# Patient Record
Sex: Female | Born: 1985 | Race: White | Hispanic: No | Marital: Single | Smoking: Never smoker
Health system: Southern US, Community
[De-identification: ages and names within clinical notes are randomized; demographics above are authoritative.]

## PROBLEM LIST (undated history)

## (undated) ENCOUNTER — Inpatient Hospital Stay (HOSPITAL_COMMUNITY): Payer: BC Managed Care – PPO

## (undated) ENCOUNTER — Inpatient Hospital Stay (HOSPITAL_COMMUNITY): Payer: Self-pay

## (undated) DIAGNOSIS — F32A Depression, unspecified: Secondary | ICD-10-CM

## (undated) DIAGNOSIS — E039 Hypothyroidism, unspecified: Secondary | ICD-10-CM

## (undated) DIAGNOSIS — F329 Major depressive disorder, single episode, unspecified: Secondary | ICD-10-CM

## (undated) DIAGNOSIS — E079 Disorder of thyroid, unspecified: Secondary | ICD-10-CM

## (undated) HISTORY — DX: Disorder of thyroid, unspecified: E07.9

## (undated) HISTORY — DX: Depression, unspecified: F32.A

## (undated) HISTORY — DX: Major depressive disorder, single episode, unspecified: F32.9

## (undated) HISTORY — PX: ORTHOPEDIC SURGERY: SHX850

---
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
## 2012-06-27 DIAGNOSIS — N6019 Diffuse cystic mastopathy of unspecified breast: Secondary | ICD-10-CM | POA: Insufficient documentation

## 2014-03-07 ENCOUNTER — Ambulatory Visit (INDEPENDENT_AMBULATORY_CARE_PROVIDER_SITE_OTHER): Payer: BLUE CROSS/BLUE SHIELD | Admitting: Sports Medicine

## 2014-03-07 ENCOUNTER — Ambulatory Visit: Payer: Self-pay | Admitting: Sports Medicine

## 2014-03-07 ENCOUNTER — Encounter: Payer: Self-pay | Admitting: Sports Medicine

## 2014-03-07 VITALS — BP 133/90 | HR 65 | Ht 66.0 in | Wt 190.0 lb

## 2014-03-07 DIAGNOSIS — S86311A Strain of muscle(s) and tendon(s) of peroneal muscle group at lower leg level, right leg, initial encounter: Secondary | ICD-10-CM

## 2014-03-07 MED ORDER — NITROGLYCERIN 0.2 MG/HR TD PT24: MEDICATED_PATCH | TRANSDERMAL | Status: DC

## 2014-03-07 NOTE — Progress Notes (Signed)
Subjective:    Patient ID: Denise Mccall, female    DOB: July 15, 1985, 29 y.o.   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 She suffered an inversion injury while running. Immediate pain and swelling. She was seen and treated by Dr. Dion Saucier. X-rays did not show a fracture. She was placed into a walking boot for about 2-3 weeks after which she transitioned into a med spec brace. All of her swelling was initially along the lateral ankle. Her persistent pain is also along the lateral ankle with radiating pain into her lateral foot. Worse with activity such as running. She was on meloxicam for a month but did not notice any benefit from this. She has had injuries and issues with his ankle in the past. She suffered a rather significant sprain while in high school. She was told that she may need an ankle reconstruction for ankle laxity but she decided to change sports instead. Since then she has had intermittent discomfort in her ankle as well as intermittent swelling but her current discomfort is different in nature than her chronic issues. No pain more proximally in the knee. Past medical history reviewed. She is on Synthroid for Hashimoto's thyroiditis. Also on birth control pills. Surgical history significant for prior anterior cruciate ligament reconstruction. She has also had a ganglion cyst removed from her left wrist and a "bone spur" removed from her right wrist No known drug allergies. She works as a Optometrist    Review of Systems     Objective:   Physical Exam Well-developed, well-nourished. No acute distress. Awake alert and oriented 3. Vital signs reviewed.  Right ankle: Full range of motion. No effusion. No soft tissue swelling. She has significant ankle laxity with a 3+ talar tilt and a positive anterior drawer. She is tender to palpation just  posterior to the lateral malleolus along the course of the peroneal tendons. Reproducible pain with peroneal tendon stretching and resisted eversion of the foot. Slight tenderness at the base of the fifth metatarsal but no swelling here. No tenderness over the navicular nor over the medial malleolus. No peroneal dislocation or subluxation with ankle circumduction. Neurovascularly intact distally.  Patient also has bilateral genu recurvatum and is able to touch each thumb to her forearm. Beighton score was not calculated but this patient likely has underlying hypermobility.  MSK ultrasound of the right ankle was performed. Limited images of the lateral ankle were obtained. There is significant fluid surrounding the peroneal tendons. It is difficult to see a discrete tear but this may be obscured by the fluid. Findings consistent with a significant peroneal tendon strain and possible partial tear.       Assessment & Plan:  Right ankle pain secondary to peroneal tendon strain/probable partial tear Hypermobility Chronic ankle instability  We will try the patient on a quarter patch of nitroglycerin daily. She has a remote history of migraines and is instructed to discontinue the nitroglycerin immediately if she experiences headaches. She will discontinue her med spec brace which she finds uncomfortable and instead will use a body helix compression sleeve with activity. Her sister-in-law is a physical therapist at [facility] and I want her to start some rehabilitation under her supervision. She will follow-up with me in 4 weeks. If symptoms persist or worsen I may need to consider more advanced imaging in the form of an  MRI. Call with questions or concerns in the interim.

## 2014-03-07 NOTE — Patient Instructions (Signed)

## 2014-04-07 ENCOUNTER — Encounter: Payer: Self-pay | Admitting: Sports Medicine

## 2014-04-07 ENCOUNTER — Ambulatory Visit (INDEPENDENT_AMBULATORY_CARE_PROVIDER_SITE_OTHER): Payer: BLUE CROSS/BLUE SHIELD | Admitting: Sports Medicine

## 2014-04-07 VITALS — BP 121/86 | HR 67 | Ht 66.0 in | Wt 190.0 lb

## 2014-04-07 DIAGNOSIS — S86311D Strain of muscle(s) and tendon(s) of peroneal muscle group at lower leg level, right leg, subsequent encounter: Secondary | ICD-10-CM

## 2014-04-07 MED ORDER — NITROGLYCERIN 0.2 MG/HR TD PT24
MEDICATED_PATCH | TRANSDERMAL | Status: DC
Start: 2014-04-07 — End: 2014-12-04

## 2014-04-07 NOTE — Progress Notes (Signed)
  Denise Mccall - 29 y.o. female MRN [id number]  Date of birth: [birth year]  SUBJECTIVE:  Including CC & ROS.  Chief Complaint  Patient presents with  . Ankle Pain   Denise Mccall presents for follow up of right ankle pain. Pt has history of frequent ankle sprains, most recent major episode in October 2015. She has been started on nitro patch therapy, says she was doing this daily for the first few weeks but recently has not been applying them. She also has been doing band therapy exercises that she was given by her sister in law who is a PT. 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 She went for a few short runs with her kids over the past few weeks and notes pain afterward. At this time she does not feel like she would want to do surgery.   HISTORY: Past Medical, Surgical, Social, and Family History Reviewed & Updated per EMR.    PHYSICAL EXAM:  VS: BP:121/86 mmHg  HR:67bpm  TEMP: ( )  RESP:   HT:5\' 6"  (167.6 cm)   WT:190 lb (86.183 kg)  BMI:30.7 PHYSICAL EXAM: General: NAD Right ankle: INSPECTION: no visible deformities, no ecchymosis, no swelling PALPATION: TTP worse in posterior lateral malleolus, less so inferior lat malleolus and base of 5th metarsal. SPECIAL TESTS: Positive anterior drawer and talar tilt test, significant ankle laxity NEUROVASCULAR STATUS: 2+ DP pulses bilaterally   ASSESSMENT & PLAN: See problem based charting & AVS for pt instructions. 1. Right ankle pain secondary to probable peroneal tendon strain/tear: slight improvement, partially adherent to nitro patch therapy but counseled regarding not seeing results this early in therapy. Refill sent for nitro patch #30 refills 3. Pt to continue PT exercises, body helix sleeve and can use body glide on achilles tendon for comfort, f/u 4 weeks for repeat ultrasound.

## 2014-04-15 ENCOUNTER — Telehealth: Payer: Self-pay | Admitting: Sports Medicine

## 2014-04-15 ENCOUNTER — Encounter: Payer: Self-pay | Admitting: *Deleted

## 2014-04-15 NOTE — Telephone Encounter (Signed)
I spoke with the patient on the phone today regarding her persistent ankle pain. She would like to discuss her case with an orthopedic surgeon. I recommended referral to Dr. Lajoyce Cornersuda at [facility]. I will refer her to him for his input and will defer further workup and treatment to his discretion.

## 2014-04-15 NOTE — Telephone Encounter (Signed)
-----   Message from Lizbeth BarkMelanie L Ceresi sent at 04/14/2014  1:43 PM EDT ----- Regarding: phone message Contact: [phone] Pt wants to discuss getting referred to a surgeon for her ankle injury.

## 2014-04-15 NOTE — Progress Notes (Signed)
Patient ID: Denise Mccall, female   DOB: Nov 16, 1985, 29 y.o.   MRN: [id number] DR DUDA Thursday MARCH 31ST AT [address] [facility] [address], [place], [place] [address] [phone]

## 2014-05-05 ENCOUNTER — Other Ambulatory Visit: Payer: Self-pay | Admitting: Orthopedic Surgery

## 2014-05-05 DIAGNOSIS — M25571 Pain in right ankle and joints of right foot: Secondary | ICD-10-CM

## 2014-05-14 ENCOUNTER — Other Ambulatory Visit: Payer: BLUE CROSS/BLUE SHIELD | Admitting: Sports Medicine

## 2014-05-16 ENCOUNTER — Ambulatory Visit
Admission: RE | Admit: 2014-05-16 | Discharge: 2014-05-16 | Disposition: A | Discharge status: Home / Self Care | Payer: BC Managed Care – PPO | Source: Ambulatory Visit | Attending: Orthopedic Surgery | Admitting: Orthopedic Surgery

## 2014-05-16 DIAGNOSIS — M25571 Pain in right ankle and joints of right foot: Secondary | ICD-10-CM

## 2014-11-13 LAB — TSH: TSH: 1.48 u[IU]/mL (ref 0.41–5.90)

## 2014-12-04 ENCOUNTER — Encounter: Payer: Self-pay | Admitting: Endocrinology

## 2014-12-04 ENCOUNTER — Ambulatory Visit (INDEPENDENT_AMBULATORY_CARE_PROVIDER_SITE_OTHER): Payer: BC Managed Care – PPO | Admitting: Endocrinology

## 2014-12-04 VITALS — BP 128/72 | HR 55 | Temp 97.8°F | Resp 14 | Ht 66.0 in | Wt 213.2 lb

## 2014-12-04 DIAGNOSIS — E038 Other specified hypothyroidism: Secondary | ICD-10-CM | POA: Diagnosis not present

## 2014-12-04 DIAGNOSIS — E063 Autoimmune thyroiditis: Secondary | ICD-10-CM | POA: Insufficient documentation

## 2014-12-04 MED ORDER — THYROID 130 MG PO TABS: ORAL_TABLET | ORAL | Status: DC

## 2014-12-04 NOTE — Progress Notes (Signed)
Patient ID: Denise Mccall, female   DOB: [birth year], 29 y.o.   MRN: [id number]            Reason for Appointment:  Hypothyroidism, new visit    History of Present Illness:   The Hypothyroidism was first diagnosed in 881994 when she was 29 years old and she was found to have a goiter on incidental exam.  Not clear if she was having any other symptoms at that time She apparently has been on levothyroxine since then  She says that when her thyroid levels are not controlled she will have symptoms of fatigue, weight gain, hair loss. She has had various doses over the last several years and has seen endocrinologist before she moved to the area about a year ago She is usually very compliant with taking her medication before breakfast and not taking any vitamins or calcium at the same time  Apparently for the last 3 months or so she has had increasing fatigue, weight gain of 20 pounds, hair loss which she thinks is related to her thyroid. However she also has been having more difficulty sleeping as well as tending to have sweating spells especially at night  The patient has been treated with  doses ranging from 125-175; previously was on brand name Synthroid and more recently has been taking levothyroxine.  Her dose has been about the same for 1-1/2 years    Lab results from previous testing is not available, recently had tests done by her gynecologist Free T4 was normal at 1.07  Lab Results  Component Value Date   TSH 1.48 11/13/2014     No past medical history on file.  No past surgical history on file.  Family History  Problem Relation Age of Onset  . Hypothyroidism Mother   . Diabetes Paternal Grandmother     Social History:  reports that she has never smoked. She does not have any smokeless tobacco history on file. Her alcohol and drug histories are not on file.  Allergies: No Known Allergies    Medication List       This list is accurate as of: 12/04/14 12:47 PM.  Always  use your most recent med list.               thyroid 130 MG tablet  Commonly known as:  ARMOUR  Take 1 pill daily and 1/2 extra weekly        Review of Systems:  ENDOCRINOLOGY:  no history of Diabetes.           Review of Systems  Constitutional: Positive for malaise/fatigue.  Eyes:       No swelling of the eyes  Cardiovascular: Positive for palpitations. Negative for leg swelling.       Occasionally he may have a feeling of skipping of her heart beat.  She thinks that when she exercises her heart rate goes very fast.  Has not been seen by any physician for this  Gastrointestinal:       She was having some abdominal pain in the right mid section and this is better with avoiding artificial sweeteners  Neurological: Negative for tingling.   Menstrual cycles have been regular since she went off birth control pill  No complaints of facial or excessive body. No complaints of flushing of the skin   Examination:    BP 128/72 mmHg  Pulse 55  Temp(Src) 97.8 F (36.6 C)  Resp 14  Ht 5\' 6"  (1.676 m)  Wt 213 lb  3.2 oz (96.707 kg)  BMI 34.43 kg/m2  SpO2 97%  GENERAL:  Large build with mild generalized obesity.   No pallor, clubbing, lymphadenopathy or edema.  Skin:  no rash or pigmentation.  EYES:  No prominence of the eyes or swelling of the eyelids  ENT: Oral mucosa and tongue normal.  THYROID:  Not enlarged, just palpable on the right side and smooth, soft.  HEART:  Normal  S1 and S2; heart rhythm is regular, no murmur or click.  CHEST:    Lungs: Vescicular breath sounds heard equally.  No crepitations/ wheeze.  ABDOMEN:  No distention.  Liver and spleen not palpable.  No other mass or tenderness.  NEUROLOGICAL: Reflexes are normal bilaterally at biceps.  JOINTS:  Normal peripheral joints.   Assessments 1. 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 She reports symptoms  of fatigue and weight gain as well as hair loss which is unexplained by her thyroid levels which were normal in October She also has nonspecific sweating episodes of unclear etiology  2.  Resting sinus bradycardia, also history of occasional palpitations  3.  Mild obesity, exogenous  4.  Insomnia and fatigue  Plan:    Since she is having nonspecific symptoms of fatigue with levothyroxine and she wants to try Armour Thyroid will try her on 130 mg dosage, 7-1/2 tablets per week empirically and see if she is subjectively any better  She will have thyroid levels done again in another month and follow-up  Have recommended that she established with a PCP for evaluation of other problems including palpitations    Metta Koranda 12/04/2014, 12:47 PM

## 2015-01-05 ENCOUNTER — Other Ambulatory Visit (INDEPENDENT_AMBULATORY_CARE_PROVIDER_SITE_OTHER): Payer: BC Managed Care – PPO

## 2015-01-05 DIAGNOSIS — E038 Other specified hypothyroidism: Secondary | ICD-10-CM

## 2015-01-05 DIAGNOSIS — E063 Autoimmune thyroiditis: Secondary | ICD-10-CM

## 2015-01-05 LAB — TSH: TSH: 3.59 u[IU]/mL (ref 0.35–4.50)

## 2015-01-05 LAB — T3, FREE: T3, Free: 4.4 pg/mL — ABNORMAL HIGH (ref 2.3–4.2)

## 2015-01-06 LAB — T4, FREE: Free T4: 0.62 ng/dL (ref 0.60–1.60)

## 2015-01-08 ENCOUNTER — Encounter: Payer: Self-pay | Admitting: Endocrinology

## 2015-01-08 ENCOUNTER — Ambulatory Visit (INDEPENDENT_AMBULATORY_CARE_PROVIDER_SITE_OTHER): Payer: BC Managed Care – PPO | Admitting: Endocrinology

## 2015-01-08 VITALS — BP 134/86 | HR 76 | Temp 97.7°F | Resp 14 | Ht 66.0 in | Wt 210.0 lb

## 2015-01-08 DIAGNOSIS — E038 Other specified hypothyroidism: Secondary | ICD-10-CM

## 2015-01-08 DIAGNOSIS — E063 Autoimmune thyroiditis: Secondary | ICD-10-CM

## 2015-01-08 NOTE — Progress Notes (Signed)
Patient ID: Denise Mccall, female   DOB: Mar 12, 1985, 29 y.o.   MRN: [id number]            Reason for Appointment:  Hypothyroidism, follow-up visit    History of Present Illness:   The Hypothyroidism was first diagnosed in 59 when she was 29 years old and she was found to have a goiter on incidental exam.  Not clear if she was having any other symptoms at that time.  She apparently has been on levothyroxine since then  She says that when her thyroid levels are not controlled she will have symptoms of fatigue, weight gain, hair loss. She has had various doses over the last several years and has seen endocrinologist before she moved to the area about a year ago The patient has been treated with  doses ranging from 125-175; previously was on brand name Synthroid and more recently has been taking levothyroxine.  Her dose has been about the same for 1-1/2 years    Recent history: On her initial consultation she was complaining of increasing fatigue, weight gain of 20 pounds, hair loss Since she was having a normal TSH with her supplement of 175 g of levothyroxine she was given a trial of Armour Thyroid which she started in mid November She is getting the Naturethroid preparation and is taking one tablet daily of the 130 mg She now says that she feels less tired and her weight has leveled off  She is usually very compliant with taking her medication before breakfast and not taking any vitamins or calcium at the same time  Lab results from recent testing:   Lab Results  Component Value Date   FREET4 0.62 01/05/2015   TSH 3.59 01/05/2015   TSH 1.48 11/13/2014     Past Medical History  Diagnosis Date  . Thyroid disease     No past surgical history on file.  Family History  Problem Relation Age of Onset  . Hypothyroidism Mother   . Diabetes Paternal Grandmother     Social History:  reports that she has never smoked. She does not have any smokeless tobacco history on file. Her  alcohol and drug histories are not on file.  Allergies: No Known Allergies    Medication List       This list is accurate as of: 01/08/15  5:06 PM.  Always use your most recent med list.               thyroid 130 MG tablet  Commonly known as:  ARMOUR  Take 1 pill daily and 1/2 extra weekly        Review of Systems:   Wt Readings from Last 3 Encounters:  01/08/15 210 lb (95.255 kg)  12/04/14 213 lb 3.2 oz (96.707 kg)  04/07/14 190 lb (86.183 kg)           ROS Menstrual cycles have been regular since she went off birth control pill but missed the cycle recently   She is asking for me to look at her right fifth finger which she twisted today   Examination:    BP 134/86 mmHg  Pulse 76  Temp(Src) 97.7 F (36.5 C)  Resp 14  Ht  (1.676 m)  Wt 210 lb (95.255 kg)  BMI 33.91 kg/m2  SpO2 96%  Has significant tenderness of the first interphalangeal joint and mild swelling  Assessments 1. 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  Ligament injury/possible fracture of little finger  Plan:    Since she is having good results with the current preparation she will continue the dose but increase the dose by half tablet weekly  She will have thyroid levels done again in another 3 months and follow-up visit  Have recommended that she established with a PCP for evaluation of other problems, meanwhile she can use a splint for her finger    Denise Mccall 01/08/2015, 5:06 PM

## 2015-01-08 NOTE — Patient Instructions (Signed)
Take 1/2 pill once weekly

## 2015-02-11 LAB — OB RESULTS CONSOLE RPR: RPR: NONREACTIVE

## 2015-02-11 LAB — OB RESULTS CONSOLE GC/CHLAMYDIA
Chlamydia: NEGATIVE
Gonorrhea: NEGATIVE

## 2015-02-11 LAB — OB RESULTS CONSOLE HEPATITIS B SURFACE ANTIGEN: HEP B S AG: NEGATIVE

## 2015-02-11 LAB — OB RESULTS CONSOLE RUBELLA ANTIBODY, IGM: Rubella: IMMUNE

## 2015-02-11 LAB — OB RESULTS CONSOLE HIV ANTIBODY (ROUTINE TESTING): HIV: NONREACTIVE

## 2015-04-07 ENCOUNTER — Other Ambulatory Visit: Payer: BC Managed Care – PPO

## 2015-04-10 ENCOUNTER — Ambulatory Visit: Payer: BC Managed Care – PPO | Admitting: Endocrinology

## 2015-04-13 ENCOUNTER — Other Ambulatory Visit: Payer: Self-pay | Admitting: *Deleted

## 2015-04-13 MED ORDER — THYROID 130 MG PO TABS: ORAL_TABLET | ORAL | Status: DC

## 2015-06-14 ENCOUNTER — Encounter (HOSPITAL_COMMUNITY): Payer: Self-pay | Admitting: *Deleted

## 2015-06-14 ENCOUNTER — Ambulatory Visit (HOSPITAL_COMMUNITY)
Admission: EM | Admit: 2015-06-14 | Discharge: 2015-06-14 | Disposition: A | Discharge status: Home / Self Care | Payer: Worker's Compensation | Attending: Family Medicine | Admitting: Family Medicine

## 2015-06-14 DIAGNOSIS — G44309 Post-traumatic headache, unspecified, not intractable: Secondary | ICD-10-CM

## 2015-06-14 DIAGNOSIS — G44319 Acute post-traumatic headache, not intractable: Secondary | ICD-10-CM

## 2015-06-14 NOTE — Discharge Instructions (Signed)
It was nice seeing you today. I am sorry about your headache injury and headache. You likely have post-concussion syndrome. Your neurologic exam is completely normal today. Basketball injury often are less associated with skull fracture or intracranial bleeding and your exam today was normal. I will recommend Tylenol as needed for pain. Please go to the ED for an imaging if you begin to vomit or feel dizzy or have change in vision. Also read about concussion below. Post-Concussion Syndrome Post-concussion syndrome is the symptoms that can occur after a head injury. These symptoms can last from weeks to months. HOME CARE   Take medicines only as told by your doctor.  Do not take aspirin.  Sleep with your head raised to help with headaches.  Avoid activities that can cause another head injury.  Do not play contact sports like football, hockey, soccer, or basketball.  Do not do other risky activities like downhill skiing, martial arts, or horseback riding until your doctor says it is okay.  Keep all follow-up visits as told by your doctor. This is important. GET HELP IF:   You have a harder time:  Paying attention.  Focusing.  Remembering.  Learning new information.  Dealing with stress.  You need more time to complete tasks.  You are easily bothered (irritable).  You have more symptoms. Get help if you have any of these symptoms for more than two weeks after your injury:   Long-lasting (chronic) headaches.  Dizziness.  Trouble balancing.  Feeling sick to your stomach (nauseous).  Trouble with your vision.  Noise or light bothers you more.  Depression.  Mood swings.  Feeling worried (anxious).  Easily bothered.  Memory problems.  Trouble concentrating or paying attention.  Sleep problems.  Feeling tired all of the time. GET HELP RIGHT AWAY IF:  You feel confused.  You feel very sleepy.  You are hard to wake up.  You feel sick to your  stomach.  You keep throwing up (vomiting).  You feel like you are moving when you are not (vertigo).  Your eyes move back and forth very quickly.  You start shaking (convulsing) or pass out (faint).  You have very bad headaches that do not get better with medicine.  You cannot use your arms or legs like normal.  One of the black centers of your eyes (pupils) is bigger than the other.  You have clear or bloody fluid coming from your nose or ears.  Your problems get worse, not better. MAKE SURE YOU:  Understand these instructions.  Will watch your condition.  Will get help right away if you are not doing well or get worse.   This information is not intended to replace advice given to you by your health care provider. Make sure you discuss any questions you have with your health care provider.   Document Released: 02/25/2004 Document Revised: 02/07/2014 Document Reviewed: 04/24/2013 Elsevier Interactive Patient Education [employer] Elsevier Inc.

## 2015-06-14 NOTE — ED Provider Notes (Signed)
CSN: [id number]     Arrival date & time 06/14/15  1408 History   None    No chief complaint on file.  (Consider location/radiation/quality/duration/timing/severity/associated sxs/prior Treatment) Patient is a 30 y.o. female presenting with head injury. The history is provided by the patient. No language interpreter was used.  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, she got hit at the back of her head with a basketball ) Time since incident:  48 hours Mechanism of injury: sports   Pain details:    Quality:  Sharp and throbbing   Severity:  Moderate (About 7/10 in severity)   Duration:  2 days   Timing:  Intermittent (Pain is worse with activity and better with rest)   Progression:  Waxing and waning Chronicity:  New Relieved by:  Rest Worsened by:  Position changes and movement Associated symptoms: headache and nausea   Associated symptoms: no blurred vision, no difficulty breathing, no disorientation, no double vision, no focal weakness, no hearing loss, no loss of consciousness, no memory loss, no neck pain, no numbness, no seizures and no vomiting   Associated symptoms comment:  Left eye pain on and off. She was a bit disoriented 2 days ago immediately after the trauma for about an hour. Risk factors: no alcohol use    Are you currently pregnant? Yes  Are you sexually active: Yes  LMP: 11/15/14  Birth control: None.    Past Medical History  Diagnosis Date  . Thyroid disease    No past surgical history on file. Family History  Problem Relation Age of Onset  . Hypothyroidism Mother   . Diabetes Paternal Grandmother    Social History  Substance Use Topics  . Smoking status: Never Smoker   . Smokeless tobacco: Not on file  . Alcohol Use: Not on file   OB History    No data available     Review of Systems  HENT: Negative for hearing loss.   Eyes: Negative for blurred vision and double  vision.  Respiratory: Negative.   Cardiovascular: Negative.   Gastrointestinal: Positive for nausea. Negative for vomiting.  Musculoskeletal: Negative for neck pain.  Neurological: Positive for headaches. Negative for dizziness, focal weakness, seizures, loss of consciousness, syncope, speech difficulty, weakness, light-headedness and numbness.  Psychiatric/Behavioral: Negative for memory loss.  All other systems reviewed and are negative.   Allergies  Review of patient's allergies indicates no known allergies.  Home Medications   Prior to Admission medications   Medication Sig Start Date End Date Taking? Authorizing Provider  thyroid (ARMOUR) 130 MG tablet Take 1 pill daily and 1/2 extra weekly 04/13/15   Reather Littler, MD   Meds Ordered and Administered this Visit  Medications - No data to display  There were no vitals taken for this visit. No data found.   Physical Exam  Constitutional: She is oriented to person, place, and time. She appears well-developed and well-nourished. No distress.  HENT:  Head: Normocephalic and atraumatic. Head is without raccoon's eyes, without Battle's sign, without abrasion, without contusion, without laceration, without right periorbital erythema and without left periorbital erythema.  Right Ear: Tympanic membrane and ear canal normal. No drainage or tenderness. No mastoid tenderness. No hemotympanum.  Left Ear: Tympanic membrane and ear canal normal. No drainage or tenderness. No mastoid tenderness. No hemotympanum.  Mouth/Throat: Uvula is midline, oropharynx is clear and moist and mucous membranes are normal.  Cardiovascular:  Normal rate and normal heart sounds.   No murmur heard. Pulmonary/Chest: Effort normal and breath sounds normal. No respiratory distress. She has no wheezes.  Abdominal:  Gravid  Musculoskeletal: Normal range of motion. She exhibits no edema.  Normal gait  Neurological: She is alert and oriented to person, place, and time.  She has normal strength and normal reflexes. She is not disoriented. She displays no tremor and normal reflexes. No cranial nerve deficit or sensory deficit. Coordination and gait normal. GCS eye subscore is 4. GCS verbal subscore is 5. GCS motor subscore is 6. She displays no Babinski's sign on the right side. She displays no Babinski's sign on the left side.  Psychiatric: She has a normal mood and affect. Her behavior is normal.  Nursing note and vitals reviewed.   ED Course  Procedures (including critical care time)  Labs Review Labs Reviewed - No data to display  Imaging Review No results found.   Visual Acuity Review  Right Eye Distance:   Left Eye Distance:   Bilateral Distance:    Right Eye Near:   Left Eye Near:    Bilateral Near:         MDM  No diagnosis found. Acute post-traumatic headache, not intractable  Post-concussion headache   Patient awake and alert with normal neurologic exam. As discussed with her she likely has post-concussion headache which can last few days to weeks. I recommended rest at home with Tylenol as needed for pain. She can also place ice pack on her head to help with her pain. I discussed red-flags warranting ED visit for imaging study such as projectile vomiting, worsening headache, blurry vision, dizziness, lethargy e.t.c. Handout also given. She verbalized understanding.  Work form completed and I also gave her return to work note. She may return to work as tolerated on 06/17/15 with no strenuous work or sport. I advised her to see her PCP in 1-2 days prior to 06/17/15 for reassessment for full return to work. She agreed with plan and verbalized understanding.    Doreene ElandKehinde T Pina Sirianni, MD 06/14/15 [phone]

## 2015-06-14 NOTE — ED Notes (Signed)
Patient reports getting hit in the back of the head on Friday with a basketball, reports headache since with mild nausea, patient is [redacted] weeks pregnant. Reports headache located to back of head that is almost constant in nature and is aggravated with activity, reports sensitivity to light. PERRLA. GCS 15.

## 2015-09-07 LAB — OB RESULTS CONSOLE GBS: GBS: POSITIVE

## 2015-09-09 ENCOUNTER — Inpatient Hospital Stay (HOSPITAL_COMMUNITY): Payer: BC Managed Care – PPO | Admitting: Anesthesiology

## 2015-09-09 ENCOUNTER — Inpatient Hospital Stay (HOSPITAL_COMMUNITY)
Admission: AD | Admit: 2015-09-09 | Discharge: 2015-09-12 | DRG: 775 | Disposition: A | Discharge status: Home / Self Care | Payer: BC Managed Care – PPO | Source: Ambulatory Visit | Attending: Obstetrics and Gynecology | Admitting: Obstetrics and Gynecology

## 2015-09-09 ENCOUNTER — Encounter (HOSPITAL_COMMUNITY): Payer: Self-pay | Admitting: *Deleted

## 2015-09-09 DIAGNOSIS — Z3490 Encounter for supervision of normal pregnancy, unspecified, unspecified trimester: Secondary | ICD-10-CM

## 2015-09-09 DIAGNOSIS — E039 Hypothyroidism, unspecified: Secondary | ICD-10-CM | POA: Diagnosis present

## 2015-09-09 DIAGNOSIS — Z833 Family history of diabetes mellitus: Secondary | ICD-10-CM | POA: Diagnosis not present

## 2015-09-09 DIAGNOSIS — O99824 Streptococcus B carrier state complicating childbirth: Secondary | ICD-10-CM | POA: Diagnosis present

## 2015-09-09 DIAGNOSIS — O99284 Endocrine, nutritional and metabolic diseases complicating childbirth: Secondary | ICD-10-CM | POA: Diagnosis present

## 2015-09-09 DIAGNOSIS — Z3A38 38 weeks gestation of pregnancy: Secondary | ICD-10-CM

## 2015-09-09 DIAGNOSIS — Z3403 Encounter for supervision of normal first pregnancy, third trimester: Secondary | ICD-10-CM | POA: Diagnosis present

## 2015-09-09 LAB — CBC
HEMATOCRIT: 37.5 % (ref 36.0–46.0)
Hemoglobin: 12.6 g/dL (ref 12.0–15.0)
MCH: 28.6 pg (ref 26.0–34.0)
MCHC: 33.6 g/dL (ref 30.0–36.0)
MCV: 85 fL (ref 78.0–100.0)
Platelets: 299 10*3/uL (ref 150–400)
RBC: 4.41 MIL/uL (ref 3.87–5.11)
RDW: 14.4 % (ref 11.5–15.5)
WBC: 21.2 10*3/uL — AB (ref 4.0–10.5)

## 2015-09-09 LAB — TYPE AND SCREEN
ABO/RH(D): O POS
Antibody Screen: NEGATIVE

## 2015-09-09 MED ORDER — LIDOCAINE HCL (PF) 1 % IJ SOLN
INTRAMUSCULAR | Status: DC | PRN
  Administered 2015-09-09 (×2): 6 mL

## 2015-09-09 MED ORDER — FENTANYL 2.5 MCG/ML BUPIVACAINE 1/10 % EPIDURAL INFUSION (WH - ANES)
14.0000 mL/h | INTRAMUSCULAR | Status: DC | PRN
  Administered 2015-09-09 – 2015-09-10 (×2): 14 mL/h via EPIDURAL
  Filled 2015-09-09 (×2): qty 125

## 2015-09-09 MED ORDER — LACTATED RINGERS IV SOLN: 500.0000 mL | Freq: Once | INTRAVENOUS | Status: DC

## 2015-09-09 MED ORDER — SOD CITRATE-CITRIC ACID 500-334 MG/5ML PO SOLN: 30.0000 mL | ORAL | Status: DC | PRN

## 2015-09-09 MED ORDER — PHENYLEPHRINE 40 MCG/ML (10ML) SYRINGE FOR IV PUSH (FOR BLOOD PRESSURE SUPPORT)
80.0000 ug | PREFILLED_SYRINGE | INTRAVENOUS | Status: DC | PRN
  Filled 2015-09-09: qty 5
  Filled 2015-09-09: qty 10

## 2015-09-09 MED ORDER — PENICILLIN G POTASSIUM 5000000 UNITS IJ SOLR
5.0000 10*6.[IU] | Freq: Once | INTRAVENOUS | Status: AC
  Administered 2015-09-09: 5 10*6.[IU] via INTRAVENOUS
  Filled 2015-09-09: qty 5

## 2015-09-09 MED ORDER — OXYCODONE-ACETAMINOPHEN 5-325 MG PO TABS: 2.0000 | ORAL_TABLET | ORAL | Status: DC | PRN

## 2015-09-09 MED ORDER — DIPHENHYDRAMINE HCL 50 MG/ML IJ SOLN
12.5000 mg | INTRAMUSCULAR | Status: DC | PRN
Start: 2015-09-09 — End: 2015-09-10

## 2015-09-09 MED ORDER — FLEET ENEMA 7-19 GM/118ML RE ENEM: 1.0000 | ENEMA | RECTAL | Status: DC | PRN

## 2015-09-09 MED ORDER — ONDANSETRON HCL 4 MG/2ML IJ SOLN
4.0000 mg | Freq: Four times a day (QID) | INTRAMUSCULAR | Status: DC | PRN
  Administered 2015-09-10: 4 mg via INTRAVENOUS
  Filled 2015-09-09: qty 2

## 2015-09-09 MED ORDER — LACTATED RINGERS IV SOLN
500.0000 mL | Freq: Once | INTRAVENOUS | Status: AC
  Administered 2015-09-09: 500 mL via INTRAVENOUS

## 2015-09-09 MED ORDER — BUTORPHANOL TARTRATE 1 MG/ML IJ SOLN: 1.0000 mg | INTRAMUSCULAR | Status: DC | PRN

## 2015-09-09 MED ORDER — ACETAMINOPHEN 325 MG PO TABS: 650.0000 mg | ORAL_TABLET | ORAL | Status: DC | PRN

## 2015-09-09 MED ORDER — PENICILLIN G POTASSIUM 5000000 UNITS IJ SOLR
2.5000 10*6.[IU] | INTRAVENOUS | Status: DC
  Administered 2015-09-10 (×2): 2.5 10*6.[IU] via INTRAVENOUS
  Filled 2015-09-09 (×4): qty 2.5

## 2015-09-09 MED ORDER — PHENYLEPHRINE 40 MCG/ML (10ML) SYRINGE FOR IV PUSH (FOR BLOOD PRESSURE SUPPORT)
80.0000 ug | PREFILLED_SYRINGE | INTRAVENOUS | Status: DC | PRN
  Filled 2015-09-09: qty 10
  Filled 2015-09-09: qty 5

## 2015-09-09 MED ORDER — EPHEDRINE 5 MG/ML INJ
10.0000 mg | INTRAVENOUS | Status: DC | PRN
  Filled 2015-09-09: qty 4

## 2015-09-09 MED ORDER — OXYTOCIN 40 UNITS IN LACTATED RINGERS INFUSION - SIMPLE MED
2.5000 [IU]/h | INTRAVENOUS | Status: DC
  Filled 2015-09-09: qty 1000

## 2015-09-09 MED ORDER — LACTATED RINGERS IV SOLN: 500.0000 mL | INTRAVENOUS | Status: DC | PRN

## 2015-09-09 MED ORDER — LACTATED RINGERS IV SOLN: INTRAVENOUS | Status: DC

## 2015-09-09 MED ORDER — LIDOCAINE HCL (PF) 1 % IJ SOLN
30.0000 mL | INTRAMUSCULAR | Status: AC | PRN
  Administered 2015-09-10: 30 mL via SUBCUTANEOUS
  Filled 2015-09-09: qty 30

## 2015-09-09 MED ORDER — OXYTOCIN BOLUS FROM INFUSION
500.0000 mL | Freq: Once | INTRAVENOUS | Status: AC
  Administered 2015-09-10: 500 mL via INTRAVENOUS

## 2015-09-09 MED ORDER — OXYCODONE-ACETAMINOPHEN 5-325 MG PO TABS
1.0000 | ORAL_TABLET | ORAL | Status: DC | PRN
Start: 2015-09-09 — End: 2015-09-10

## 2015-09-09 NOTE — Anesthesia Procedure Notes (Signed)
Epidural Patient location during procedure: OB  Staffing Anesthesiologist: Geselle Cardosa  Preanesthetic Checklist Completed: patient identified, site marked, surgical consent, pre-op evaluation, timeout performed, IV checked, risks and benefits discussed and monitors and equipment checked  Epidural Patient position: sitting Prep: DuraPrep Patient monitoring: blood pressure and heart rate Approach: midline Location: L3-L4 Injection technique: LOR saline  Needle:  Needle type: Tuohy  Needle gauge: 17 G Needle length: 9 cm Needle insertion depth: 6 cm Catheter type: closed end flexible Catheter size: 19 Gauge Catheter at skin depth: 14 cm Test dose: negative and Other  Assessment Events: blood not aspirated, injection not painful, no injection resistance, negative IV test and no paresthesia  Additional Notes Reason for block:procedure for pain     

## 2015-09-09 NOTE — Anesthesia Preprocedure Evaluation (Addendum)
Anesthesia Evaluation  Patient identified by MRN, date of birth, ID band Patient awake    Reviewed: Allergy & Precautions, NPO status , Patient's Chart, lab work & pertinent test results  Airway Mallampati: II  TM Distance: >3 FB Neck ROM: Full    Dental no notable dental hx.    Pulmonary neg pulmonary ROS,    Pulmonary exam normal breath sounds clear to auscultation       Cardiovascular negative cardio ROS Normal cardiovascular exam Rhythm:Regular Rate:Normal     Neuro/Psych negative neurological ROS  negative psych ROS   GI/Hepatic negative GI ROS, Neg liver ROS,   Endo/Other  Hypothyroidism   Renal/GU negative Renal ROS  negative genitourinary   Musculoskeletal negative musculoskeletal ROS (+)   Abdominal   Peds negative pediatric ROS (+)  Hematology negative hematology ROS (+)   Anesthesia Other Findings   Reproductive/Obstetrics negative OB ROS                             Anesthesia Physical Anesthesia Plan  ASA: II  Anesthesia Plan: Epidural   Post-op Pain Management:    Induction: Intravenous  Airway Management Planned: Natural Airway  Additional Equipment:   Intra-op Plan:   Post-operative Plan:   Informed Consent: I have reviewed the patients History and Physical, chart, labs and discussed the procedure including the risks, benefits and alternatives for the proposed anesthesia with the patient or authorized representative who has indicated his/her understanding and acceptance.   Dental advisory given  Plan Discussed with: CRNA  Anesthesia Plan Comments: (Informed consent obtained prior to proceeding including risk of failure, 1% risk of PDPH, risk of minor discomfort and bruising.  Discussed rare but serious complications including epidural abscess, permanent nerve injury, epidural hematoma.  Discussed alternatives to epidural analgesia and patient desires to  proceed.  Timeout performed pre-procedure verifying patient name, procedure, and platelet count.  Patient tolerated procedure well.  GBS positive, receiving antibiotics, WBC 21)       Anesthesia Quick Evaluation

## 2015-09-09 NOTE — MAU Note (Signed)
Pt reports having ctx on and off since midnight. Closer and stronger this evening. Denies and vag bleeding reports some mucusy discharge.

## 2015-09-10 ENCOUNTER — Encounter (HOSPITAL_COMMUNITY): Payer: Self-pay

## 2015-09-10 LAB — ABO/RH: ABO/RH(D): O POS

## 2015-09-10 LAB — RPR: RPR Ser Ql: NONREACTIVE

## 2015-09-10 MED ORDER — THYROID 130 MG PO TABS
130.0000 mg | ORAL_TABLET | Freq: Every day | ORAL | Status: DC
  Administered 2015-09-10 – 2015-09-12 (×3): 130 mg via ORAL

## 2015-09-10 MED ORDER — ONDANSETRON HCL 4 MG/2ML IJ SOLN: 4.0000 mg | INTRAMUSCULAR | Status: DC | PRN

## 2015-09-10 MED ORDER — ZOLPIDEM TARTRATE 5 MG PO TABS
5.0000 mg | ORAL_TABLET | Freq: Every evening | ORAL | Status: DC | PRN
Start: 2015-09-10 — End: 2015-09-12

## 2015-09-10 MED ORDER — DIPHENHYDRAMINE HCL 25 MG PO CAPS: 25.0000 mg | ORAL_CAPSULE | Freq: Four times a day (QID) | ORAL | Status: DC | PRN

## 2015-09-10 MED ORDER — ONDANSETRON HCL 4 MG PO TABS: 4.0000 mg | ORAL_TABLET | ORAL | Status: DC | PRN

## 2015-09-10 MED ORDER — ACETAMINOPHEN 325 MG PO TABS: 650.0000 mg | ORAL_TABLET | ORAL | Status: DC | PRN

## 2015-09-10 MED ORDER — OXYTOCIN 40 UNITS IN LACTATED RINGERS INFUSION - SIMPLE MED
1.0000 m[IU]/min | INTRAVENOUS | Status: DC
  Administered 2015-09-10: 2 m[IU]/min via INTRAVENOUS

## 2015-09-10 MED ORDER — PRENATAL MULTIVITAMIN CH
1.0000 | ORAL_TABLET | Freq: Every day | ORAL | Status: DC
  Administered 2015-09-10 – 2015-09-12 (×3): 1 via ORAL
  Filled 2015-09-10 (×3): qty 1

## 2015-09-10 MED ORDER — TERBUTALINE SULFATE 1 MG/ML IJ SOLN
0.2500 mg | Freq: Once | INTRAMUSCULAR | Status: DC | PRN
  Filled 2015-09-10: qty 1

## 2015-09-10 MED ORDER — SIMETHICONE 80 MG PO CHEW: 80.0000 mg | CHEWABLE_TABLET | ORAL | Status: DC | PRN

## 2015-09-10 MED ORDER — OXYCODONE HCL 5 MG PO TABS: 10.0000 mg | ORAL_TABLET | ORAL | Status: DC | PRN

## 2015-09-10 MED ORDER — BENZOCAINE-MENTHOL 20-0.5 % EX AERO
1.0000 "application " | INHALATION_SPRAY | CUTANEOUS | Status: DC | PRN
  Filled 2015-09-10: qty 56

## 2015-09-10 MED ORDER — OXYCODONE HCL 5 MG PO TABS: 5.0000 mg | ORAL_TABLET | ORAL | Status: DC | PRN

## 2015-09-10 MED ORDER — COCONUT OIL OIL: 1.0000 "application " | TOPICAL_OIL | Status: DC | PRN

## 2015-09-10 MED ORDER — IBUPROFEN 600 MG PO TABS
600.0000 mg | ORAL_TABLET | Freq: Four times a day (QID) | ORAL | Status: DC
  Administered 2015-09-10 – 2015-09-12 (×9): 600 mg via ORAL
  Filled 2015-09-10 (×10): qty 1

## 2015-09-10 MED ORDER — SENNOSIDES-DOCUSATE SODIUM 8.6-50 MG PO TABS
2.0000 | ORAL_TABLET | ORAL | Status: DC
  Administered 2015-09-10 – 2015-09-11 (×2): 2 via ORAL
  Filled 2015-09-10 (×2): qty 2

## 2015-09-10 MED ORDER — DIBUCAINE 1 % RE OINT: 1.0000 "application " | TOPICAL_OINTMENT | RECTAL | Status: DC | PRN

## 2015-09-10 MED ORDER — TETANUS-DIPHTH-ACELL PERTUSSIS 5-2.5-18.5 LF-MCG/0.5 IM SUSP: 0.5000 mL | Freq: Once | INTRAMUSCULAR | Status: DC

## 2015-09-10 MED ORDER — WITCH HAZEL-GLYCERIN EX PADS: 1.0000 "application " | MEDICATED_PAD | CUTANEOUS | Status: DC | PRN

## 2015-09-10 NOTE — Anesthesia Postprocedure Evaluation (Signed)
Anesthesia Post Note  Patient: Denise Mccall  Procedure(s) Performed: * No procedures listed *  Patient location during evaluation: Mother Baby Anesthesia Type: Epidural Level of consciousness: awake and alert Pain management: pain level controlled Vital Signs Assessment: post-procedure vital signs reviewed and stable Respiratory status: spontaneous breathing and nonlabored ventilation Cardiovascular status: stable Postop Assessment: no headache, no backache, patient able to bend at knees, epidural receding, no signs of nausea or vomiting and adequate PO intake Anesthetic complications: no     Last Vitals:  Vitals:   09/10/15 0950 09/10/15 1050  BP: 138/72 (!) 141/75  Pulse: 72 82  Resp: 18 18  Temp: 37.1 C 36.9 C    Last Pain:  Vitals:   09/10/15 1050  TempSrc: Oral  PainSc: 0-No pain   Pain Goal: Patients Stated Pain Goal: 0 (09/09/15 2230)               Denise Mccall

## 2015-09-10 NOTE — H&P (Signed)
Denise Mccall is a 30 y.o. female presenting for CTX since Tuesday with increasing intensity.  She had no LOF.  Active FM.  Currently comfortable with epidural.  Antepartum course complicated by hypothyroidism; well controlled this pregnancy.  GBS positive.    OB History    Gravida Para Term Preterm AB Living   1             SAB TAB Ectopic Multiple Live Births                 Past Medical History:  Diagnosis Date  . Thyroid disease    History reviewed. No pertinent surgical history. Family History: family history includes Diabetes in her paternal grandmother; Hypothyroidism in her mother. Social History:  reports that she has never smoked. She has never used smokeless tobacco. She reports that she does not use drugs. Her alcohol history is not on file.     Maternal Diabetes: No Genetic Screening: Normal Maternal Ultrasounds/Referrals: Normal Fetal Ultrasounds or other Referrals:  None Maternal Substance Abuse:  No Significant Maternal Medications:  Meds include: Other: Nature-throid Significant Maternal Lab Results:  Lab values include: Group B Strep positive Other Comments:  None  ROS Maternal Medical History:  Reason for admission: Contractions.   Contractions: Onset was 2 days ago.   Frequency: regular.   Perceived severity is moderate.    Fetal activity: Perceived fetal activity is normal.   Last perceived fetal movement was within the past hour.    Prenatal complications: no prenatal complications Prenatal Complications - Diabetes: none.    Dilation: 9 Effacement (%): 100 Station: -1 Exam by:: Denise MediaK. Anderson, RN Blood pressure (!) 142/76, pulse 65, temperature 98.1 F (36.7 C), resp. rate 18, height 5\' 6"  (1.676 m), weight 205 lb (93 kg), unknown if currently breastfeeding.  C/c/+1, AROM, clear  Maternal Exam:  Uterine Assessment: Contraction strength is firm.  Contraction frequency is regular.   Abdomen: Fundal height is c/w dates.   Estimated fetal  weight is 8#.   Fetal presentation: vertex  Introitus: Normal vulva. Amniotic fluid character: clear.  Pelvis: adequate for delivery.   Cervix: Cervix evaluated by digital exam.     Physical Exam  Constitutional: She is oriented to person, place, and time. She appears well-developed and well-nourished.  GI: Soft. There is no tenderness. There is no rebound and no guarding.  Neurological: She is alert and oriented to person, place, and time.  Skin: Skin is warm and dry. No erythema.  Psychiatric: She has a normal mood and affect. Her behavior is normal.    Prenatal labs: ABO, Rh: --/--/O POS, O POS (08/09 2155) Antibody: NEG (08/09 2155) Rubella: Immune (01/11 0000) RPR: Nonreactive (01/11 0000)  HBsAg: Negative (01/11 0000)  HIV: Non-reactive (01/11 0000)  GBS: Positive (08/07 0000)   Assessment/Plan: 30yo G1 at [id number] with SOL -PCN for GBS ppx -Anticipate NSVD   Denise Mccall 09/10/2015, 3:30 AM

## 2015-09-10 NOTE — Lactation Note (Signed)
This note was copied from a baby's chart. Lactation Consultation Note  Patient Name: Boy Domenica Reamermanda Vincelette [id number]'s Date: 09/10/2015 Reason for consult: Follow-up assessment Baby at 14 hr of life. RN asked to help with latch. Mom is worried because baby is sucking his top lip but not staying latched more than a few minutes. Had mom lay back and showed her how to compress the breast. She has large breast with short shaft nipples. With visual assessment only baby has nice gape, good tongue movement, and flanges lips after he starts sucking. Mom is aware of lactation services and support group. She will call as needed.   Maternal Data    Feeding Feeding Type: Breast Fed Length of feed: 10 min  LATCH Score/Interventions Latch: Repeated attempts needed to sustain latch, nipple held in mouth throughout feeding, stimulation needed to elicit sucking reflex. Intervention(s): Skin to skin;Teach feeding cues;Waking techniques Intervention(s): Adjust position;Assist with latch;Breast massage;Breast compression  Audible Swallowing: A few with stimulation Intervention(s): Skin to skin;Hand expression Intervention(s): Hand expression;Alternate breast massage  Type of Nipple: Everted at rest and after stimulation (short shaft) Intervention(s): Shells  Comfort (Breast/Nipple): Soft / non-tender     Hold (Positioning): Full assist, staff holds infant at breast Intervention(s): Support Pillows;Position options  LATCH Score: 6  Lactation Tools Discussed/Used WIC Program: No   Consult Status Consult Status: Follow-up Date: 09/11/15 Follow-up type: In-patient    Rulon Eisenmengerlizabeth E Aaniyah Strohm 09/10/2015, 10:22 PM

## 2015-09-10 NOTE — Anesthesia Pain Management Evaluation Note (Signed)
  CRNA Pain Management Visit Note  Patient: Denise Mccall, 30 y.o., female  "Hello I am a member of the anesthesia teamDomenica Mccall at [facility]. We have an anesthesia team available at all times to provide care throughout the hospital, including epidural management and anesthesia for C-section. I don't know your plan for the delivery whether it a natural birth, water birth, IV sedation, nitrous supplementation, doula or epidural, but we want to meet your pain goals."   1.Was your pain managed to your expectations on prior hospitalizations?   No prior hospitalizations  2.What is your expectation for pain management during this hospitalization?     Epidural  3.How can we help you reach that goal? Epidural in place at time of visit patient pushing  Record the patient's initial score and the patient's pain goal.   Pain: nurse states patient pushing at this time and is comfortable  Pain Goal: unable to assess patient pushing [facility] wants you to be able to say your pain was always managed very well.  Rica RecordsICKELTON,Denise Salemi 09/10/2015

## 2015-09-10 NOTE — Progress Notes (Signed)
Delivery Note At 7:57 AM a viable female was delivered via Vaginal, Spontaneous Delivery (Presentation:LOA ;asynclytic  ).  APGAR: 9, 9; weight  .   Placenta status:intact , .  Cord:3 vessels  with the following complications:to pathology .  Cord pH: pendingFHT 90-100s between pushes, crowning. After discussion with patient, second degree MLE done. Double nuchal cord.  Anesthesia:  Epidural, 1% lidocaine local Episiotomy: Median, second degree Lacerations: None Suture Repair: 2.0 vicryl rapide Est. Blood Loss (mL):    Mom to postpartum.  Baby to Couplet care / Skin to Skin.  Roi Jafari II,Billee Balcerzak E 09/10/2015, 8:11 AM

## 2015-09-10 NOTE — Lactation Note (Signed)
This note was copied from a baby's chart. Lactation Consultation Note  Patient Name: Denise Mccall ZOXWR'UToday's Date: 09/10/2015 Reason for consult: Initial assessment Baby 4 hours old. Parents report that baby has been sucking his upper lip. Demonstrated to parents how to use finger for suck training. Baby gently sucking finger and then sleepy at the breast. However, demonstrated football position and enc mom to continue to hold baby STS and allow to nuzzle at the breast. Mom able to hand express a small amount of colostrum. [facility] given [facility], aware of OP/BFSG and LC phone line assistance after D/C.   Maternal Data Has patient been taught Hand Expression?: Yes Does the patient have breastfeeding experience prior to this delivery?: No  Feeding Feeding Type: Breast Fed Length of feed: 0 min  LATCH Score/Interventions Latch: Too sleepy or reluctant, no latch achieved, no sucking elicited. Intervention(s): Skin to skin;Teach feeding cues;Waking techniques Intervention(s): Adjust position;Assist with latch;Breast compression  Audible Swallowing: None Intervention(s): Skin to skin;Hand expression  Type of Nipple: Flat Intervention(s): Shells  Comfort (Breast/Nipple): Soft / non-tender     Hold (Positioning): Assistance needed to correctly position infant at breast and maintain latch. Intervention(s): Breastfeeding basics reviewed;Support Pillows;Position options;Skin to skin  LATCH Score: 4  Lactation Tools Discussed/Used     Consult Status Consult Status: Follow-up Date: 09/11/15 Follow-up type: In-patient    Sherlyn HayJennifer D Naya Ilagan 09/10/2015, 12:30 PM

## 2015-09-11 LAB — CBC
HCT: 30.7 % — ABNORMAL LOW (ref 36.0–46.0)
HEMOGLOBIN: 10.3 g/dL — AB (ref 12.0–15.0)
MCH: 28.9 pg (ref 26.0–34.0)
MCHC: 33.6 g/dL (ref 30.0–36.0)
MCV: 86 fL (ref 78.0–100.0)
Platelets: 228 10*3/uL (ref 150–400)
RBC: 3.57 MIL/uL — AB (ref 3.87–5.11)
RDW: 14.7 % (ref 11.5–15.5)
WBC: 14.1 10*3/uL — ABNORMAL HIGH (ref 4.0–10.5)

## 2015-09-11 NOTE — Progress Notes (Signed)
Post Partum Day 1 Subjective: no complaints, up ad lib, voiding and tolerating PO  Objective: Blood pressure 112/63, pulse (!) 52, temperature 98 F (36.7 C), temperature source Oral, resp. rate 18, height 5\' 6"  (1.676 m), weight 205 lb (93 kg), unknown if currently breastfeeding.  Physical Exam:  General: alert and cooperative Lochia: appropriate Uterine Fundus: firm Incision: healing well DVT Evaluation: No evidence of DVT seen on physical exam. Negative Homan's sign. No cords or calf tenderness. No significant calf/ankle edema.   Recent Labs  09/09/15 2155 09/11/15 0507  HGB 12.6 10.3*  HCT 37.5 30.7*    Assessment/Plan: Plan for discharge tomorrow,  circ prior to discharge   LOS: 2 days   CURTIS,CAROL G 09/11/2015, 8:30 AM

## 2015-09-11 NOTE — Lactation Note (Signed)
This note was copied from a baby's chart. Lactation Consultation Note  Patient Name: Denise Mccall Denise Mccall Reason for consult: Follow-up assessment  "Denise Mccall" is s/p circ earlier today. Parents report that he has had about 3 successful feedings since birth (he is now 6034 hours old). Denise Mccall was put to breast during consult, but did not latch. In light of elevated bili levels, I encouraged Mom to hand express so we can supplement w/her EBM. Hand expression taught to Mom.  We expressed 1mL w/ease and finger-fed it to Denise Mccall (he was sleepy & not interested in spoon-feeding at that moment).  Colostrum vial provided.   Mom reports + breast changes w/pregnancy.   Mom has my # to call for assist w/next feeding, if desired. Denise Mccall, [facility]ccall [facility], 6:24 PM

## 2015-09-12 MED ORDER — IBUPROFEN 600 MG PO TABS: 600.0000 mg | ORAL_TABLET | Freq: Four times a day (QID) | ORAL | 0 refills | Status: DC

## 2015-09-12 MED ORDER — OXYCODONE HCL 5 MG PO TABS: 5.0000 mg | ORAL_TABLET | ORAL | 0 refills | Status: DC | PRN

## 2015-09-12 NOTE — Lactation Note (Addendum)
This note was copied from a baby's chart. Lactation Consultation Note  Baby latched in cross cradle upon entering. Sucks and swallows observed. Encouraged deep latch and massage.  Reminded parents to feed q3 until baby is larger than 6 lbs. Breastfeed on both breasts per feeding if baby desires. Mom encouraged to feed baby 8-12 times/24 hours and with feeding cues at least every 3 hours. Reviewed engorgement care and monitoring voids/stools. Discussed pumping/bottle strategy for going back to work. Encouraged mother to attend support group. Mother knows if baby is having a difficult time latching she can express breast milk and interest him with drops of bm on spoon.  Patient Name: Boy Domenica Reamermanda Mccullar ZOXWR'UToday's Date: 09/12/2015 Reason for consult: Follow-up assessment   Maternal Data    Feeding Feeding Type: Breast Fed Length of feed: 35 min  LATCH Score/Interventions Latch: Grasps breast easily, tongue down, lips flanged, rhythmical sucking. (latched upon entering) Intervention(s): Skin to skin;Waking techniques Intervention(s): Adjust position;Assist with latch;Breast massage;Breast compression  Audible Swallowing: A few with stimulation Intervention(s): Skin to skin Intervention(s): Alternate breast massage  Type of Nipple: Everted at rest and after stimulation Intervention(s): Reverse pressure  Comfort (Breast/Nipple): Filling, red/small blisters or bruises, mild/mod discomfort  Problem noted: Filling  Hold (Positioning): Assistance needed to correctly position infant at breast and maintain latch. Intervention(s): Breastfeeding basics reviewed;Support Pillows;Position options;Skin to skin  LATCH Score: 7  Lactation Tools Discussed/Used     Consult Status Consult Status: Complete    Hardie PulleyBerkelhammer, Althea Backs Boschen 09/12/2015, 9:58 AM

## 2015-09-12 NOTE — Discharge Summary (Signed)
Obstetric Discharge Summary Reason for Admission: onset of labor Prenatal Procedures: none Intrapartum Procedures: spontaneous vaginal delivery Postpartum Procedures: none Complications-Operative and Postpartum: 2 degree perineal laceration Hemoglobin  Date Value Ref Range Status  09/11/2015 10.3 (L) 12.0 - 15.0 g/dL Final   HCT  Date Value Ref Range Status  09/11/2015 30.7 (L) 36.0 - 46.0 % Final    Physical Exam:  General: alert, cooperative, appears stated age and no distress Lochia: appropriate Uterine Fundus: firm Incision: healing well DVT Evaluation: No evidence of DVT seen on physical exam.  Discharge Diagnoses: Term Pregnancy-delivered  Discharge Information: Date: 09/12/2015 Activity: pelvic rest Diet: routine Medications: Ibuprofen and Percocet Condition: stable Instructions: refer to practice specific booklet Discharge to: home   Newborn Data: Live born female  Birth Weight: 6 lb 1.7 oz (2770 g) APGAR: 9, 9  Home with mother.  Hiroyuki Ozanich C 09/12/2015, 7:23 AM

## 2016-03-02 ENCOUNTER — Encounter (HOSPITAL_COMMUNITY): Payer: Self-pay | Admitting: Emergency Medicine

## 2016-03-02 ENCOUNTER — Ambulatory Visit (HOSPITAL_COMMUNITY)
Admission: EM | Admit: 2016-03-02 | Discharge: 2016-03-02 | Disposition: A | Discharge status: Home / Self Care | Payer: BC Managed Care – PPO | Attending: Family Medicine | Admitting: Family Medicine

## 2016-03-02 DIAGNOSIS — H6980 Other specified disorders of Eustachian tube, unspecified ear: Secondary | ICD-10-CM

## 2016-03-02 DIAGNOSIS — J00 Acute nasopharyngitis [common cold]: Secondary | ICD-10-CM | POA: Diagnosis not present

## 2016-03-02 MED ORDER — IPRATROPIUM BROMIDE 0.06 % NA SOLN: 2.0000 | Freq: Four times a day (QID) | NASAL | 0 refills | Status: DC

## 2016-03-02 MED ORDER — AZITHROMYCIN 250 MG PO TABS: 250.0000 mg | ORAL_TABLET | Freq: Every day | ORAL | 0 refills | Status: DC

## 2016-03-02 NOTE — ED Triage Notes (Addendum)
Right ear pain that started Saturday.  Denies any fever.  Throat is sore on right side, same side as ear is hurting.    Patient is breast feeding

## 2016-03-02 NOTE — ED Provider Notes (Signed)
CSN: [id number]     Arrival date & time 03/02/16  1629 History   First MD Initiated Contact with Patient 03/02/16 1714     Chief Complaint  Patient presents with  . Otalgia   (Consider location/radiation/quality/duration/timing/severity/associated sxs/prior Treatment) Patient c/o left ear discomfort and congestion.   The history is provided by the patient.  Otalgia  Location:  Left Behind ear:  No abnormality Quality:  Aching Severity:  No pain Onset quality:  Sudden Duration:  4 days Timing:  Constant Progression:  Waxing and waning Chronicity:  New Relieved by:  Nothing Worsened by:  Nothing Ineffective treatments:  None tried   Past Medical History:  Diagnosis Date  . Thyroid disease    Past Surgical History:  Procedure Laterality Date  . ORTHOPEDIC SURGERY     Family History  Problem Relation Age of Onset  . Hypothyroidism Mother   . Diabetes Paternal Grandmother    Social History  Substance Use Topics  . Smoking status: Never Smoker  . Smokeless tobacco: Never Used  . Alcohol use 0.0 oz/week   OB History    Gravida Para Term Preterm AB Living   1 1 1     1    SAB TAB Ectopic Multiple Live Births         0 1     Review of Systems  Constitutional: Negative.   HENT: Positive for ear pain.   Eyes: Negative.   Respiratory: Negative.   Cardiovascular: Negative.   Gastrointestinal: Negative.   Endocrine: Negative.   Genitourinary: Negative.   Musculoskeletal: Negative.   Allergic/Immunologic: Negative.   Neurological: Negative.   Hematological: Negative.   Psychiatric/Behavioral: Negative.     Allergies  Patient has no known allergies.  Home Medications   Prior to Admission medications   Medication Sig Start Date End Date Taking? Authorizing Provider  Sertraline HCl (ZOLOFT PO) Take by mouth.   Yes Historical Provider, MD  azithromycin (ZITHROMAX) 250 MG tablet Take 1 tablet (250 mg total) by mouth daily. Take first 2 tablets together, then 1  every day until finished. 03/02/16   Deatra CanterWilliam J Oxford, FNP  ibuprofen (ADVIL,MOTRIN) 600 MG tablet Take 1 tablet (600 mg total) by mouth every 6 (six) hours. 09/12/15   Candice Campavid Lowe, MD  ipratropium (ATROVENT) 0.06 % nasal spray Place 2 sprays into both nostrils 4 (four) times daily. 03/02/16   Deatra CanterWilliam J Oxford, FNP  oxyCODONE (OXY IR/ROXICODONE) 5 MG immediate release tablet Take 1 tablet (5 mg total) by mouth every 4 (four) hours as needed (pain scale 4-7). 09/12/15   Candice Campavid Lowe, MD  Prenatal Vit-Fe Fumarate-FA (PRENATAL MULTIVITAMIN) TABS tablet Take 1 tablet by mouth at bedtime.    Historical Provider, MD  thyroid (NATURE-THROID) 130 MG tablet Take 130-195 mg by mouth daily. Take 1 daily and an extra 1/2 tab on sunday    Historical Provider, MD   Meds Ordered and Administered this Visit  Medications - No data to display  BP 115/68 (BP Location: Right Arm)   Pulse (!) 51   Temp 98.4 F (36.9 C) (Oral)   SpO2 99%  No data found.   Physical Exam  Constitutional: She appears well-developed and well-nourished.  HENT:  Head: Normocephalic and atraumatic.  Right Ear: External ear normal.  Left Ear: External ear normal.  Mouth/Throat: Oropharynx is clear and moist.  Eyes: Conjunctivae and EOM are normal. Pupils are equal, round, and reactive to light.  Neck: Normal range of motion. Neck supple.  Cardiovascular: Normal  rate, regular rhythm and normal heart sounds.   Pulmonary/Chest: Effort normal and breath sounds normal.  Abdominal: Soft. Bowel sounds are normal.  Nursing note and vitals reviewed.   Urgent Care Course     Procedures (including critical care time)  Labs Review Labs Reviewed - No data to display  Imaging Review No results found.   Visual Acuity Review  Right Eye Distance:   Left Eye Distance:   Bilateral Distance:    Right Eye Near:   Left Eye Near:    Bilateral Near:         MDM   1. Dysfunction of Eustachian tube, unspecified laterality   2. Acute  nasopharyngitis    Zpak Atrovent  Push po fluids, rest, tylenol and motrin otc prn as directed for fever, arthralgias, and myalgias.  Follow up prn if sx's continue or persist.    Deatra Canter, FNP 03/02/16 [id number]

## 2016-06-15 ENCOUNTER — Encounter: Payer: Self-pay | Admitting: Gastroenterology

## 2016-07-15 ENCOUNTER — Encounter: Payer: Self-pay | Admitting: *Deleted

## 2016-07-20 ENCOUNTER — Encounter (INDEPENDENT_AMBULATORY_CARE_PROVIDER_SITE_OTHER): Payer: Self-pay

## 2016-07-20 ENCOUNTER — Encounter: Payer: Self-pay | Admitting: Gastroenterology

## 2016-07-20 ENCOUNTER — Ambulatory Visit (INDEPENDENT_AMBULATORY_CARE_PROVIDER_SITE_OTHER): Payer: BC Managed Care – PPO | Admitting: Gastroenterology

## 2016-07-20 VITALS — BP 116/80 | HR 72 | Ht 66.0 in | Wt 229.0 lb

## 2016-07-20 DIAGNOSIS — K641 Second degree hemorrhoids: Secondary | ICD-10-CM | POA: Diagnosis not present

## 2016-07-20 DIAGNOSIS — K581 Irritable bowel syndrome with constipation: Secondary | ICD-10-CM | POA: Diagnosis not present

## 2016-07-20 DIAGNOSIS — K625 Hemorrhage of anus and rectum: Secondary | ICD-10-CM | POA: Insufficient documentation

## 2016-07-20 DIAGNOSIS — R1010 Upper abdominal pain, unspecified: Secondary | ICD-10-CM | POA: Diagnosis not present

## 2016-07-20 DIAGNOSIS — K5900 Constipation, unspecified: Secondary | ICD-10-CM | POA: Diagnosis not present

## 2016-07-20 MED ORDER — NA SULFATE-K SULFATE-MG SULF 17.5-3.13-1.6 GM/177ML PO SOLN: ORAL | 0 refills | Status: DC

## 2016-07-20 NOTE — Patient Instructions (Signed)
You have been scheduled for a colonoscopy. Please follow written instructions given to you at your visit today.  Please pick up your prep supplies at the pharmacy within the next 1-3 days. If you use inhalers (even only as needed), please bring them with you on the day of your procedure. Your physician has requested that you go to www.startemmi.com and enter the access code given to you at your visit today. This web site gives a general overview about your procedure. However, you should still follow specific instructions given to you by our office regarding your preparation for the procedure.  Use Benefiber 1 tablspoon three times a day as needed   Use IB Denise Mccall three times a day as needed

## 2016-07-20 NOTE — Progress Notes (Signed)
Denise Mccall    [id number]    Oct 12, 1985  Primary Care Physician:Rama, Maryruth Bun, MD  Referring Physician: Lucy Antigua, MD [address] [place], [place] [address]  Chief complaint:  Rectal bleeding, constipation  HPI: 31 year old female with history of Hashimoto's disease here for evaluation with complaints of chronic constipation, anal fullness, itching and intermittent bright red blood per rectum She started having anorectal symptoms since January 2018. Having small volume bright red blood per rectum when she wipes and sometimes also coating stool and in the toilet. She is having bleeding with most bowel movements . Constipation was worse around January when her TSH level was 56 with severe hypothyroidism , constipation has since improved but continues to have irregular bowel habits .  She has intermittent mid abdomen pain, is worse with stress, and sugar free drinks, intermittent episodes with varying intensity about once a day.  Denies any nausea, vomiting, heartburn, dysphagia, odynophagia or melena. Denies excessive use of nsaids. She was taking them frequently last month when she had eye infection. Father with Crohn's disease since his 46's, also had multiple skin cancers and kidney cancer Maternal grandfather with lymphoma     Outpatient Encounter Prescriptions as of 07/20/2016  Medication Sig  . ibuprofen (ADVIL,MOTRIN) 200 MG tablet Take 200 mg by mouth every 6 (six) hours as needed.  Marland Kitchen ipratropium (ATROVENT) 0.06 % nasal spray Place 2 sprays into both nostrils 4 (four) times daily.  . NP THYROID 15 MG tablet   . [DISCONTINUED] thyroid (NATURE-THROID) 130 MG tablet Take 130-195 mg by mouth daily. Take 1 daily and an extra 1/2 tab on sunday  . [DISCONTINUED] azithromycin (ZITHROMAX) 250 MG tablet Take 1 tablet (250 mg total) by mouth daily. Take first 2 tablets together, then 1 every day until finished.  . [DISCONTINUED] ibuprofen  (ADVIL,MOTRIN) 600 MG tablet Take 1 tablet (600 mg total) by mouth every 6 (six) hours.  . [DISCONTINUED] oxyCODONE (OXY IR/ROXICODONE) 5 MG immediate release tablet Take 1 tablet (5 mg total) by mouth every 4 (four) hours as needed (pain scale 4-7).  . [DISCONTINUED] Prenatal Vit-Fe Fumarate-FA (PRENATAL MULTIVITAMIN) TABS tablet Take 1 tablet by mouth at bedtime.  . [DISCONTINUED] Sertraline HCl (ZOLOFT PO) Take by mouth.   No facility-administered encounter medications on file as of 07/20/2016.     Allergies as of 07/20/2016  . (No Known Allergies)    Past Medical History:  Diagnosis Date  . Depression   . Thyroid disease     Past Surgical History:  Procedure Laterality Date  . KNEE ARTHROSCOPY W/ ACL RECONSTRUCTION  2001  . ORTHOPEDIC SURGERY      Family History  Problem Relation Age of Onset  . Hypothyroidism Mother   . Diabetes Paternal Grandmother   . Skin cancer Father   . Kidney cancer Father   . Heart disease Father   . Crohn's disease Father     Social History   Social History  . Marital status: Single    Spouse name: N/A  . Number of children: 1  . Years of education: N/A   Occupational History  . Not on file.   Social History Main Topics  . Smoking status: Never Smoker  . Smokeless tobacco: Never Used  . Alcohol use 0.0 oz/week  . Drug use: No  . Sexual activity: Yes    Birth control/ protection: None   Other Topics Concern  . Not on file  Social History Narrative  . No narrative on file      Review of systems: Review of Systems  Constitutional: Negative for fever and chills.  HENT: Negative.   Eyes: Negative for blurred vision.  Respiratory: Negative for cough, shortness of breath and wheezing.   Cardiovascular: Negative for chest pain and palpitations.  Gastrointestinal: as per HPI Genitourinary: Negative for dysuria, urgency, frequency and hematuria.  Musculoskeletal: Negative for myalgias, back pain and joint pain.  Skin:  Negative for itching and rash.  Neurological: Negative for dizziness, tremors, focal weakness, seizures and loss of consciousness.  Endo/Heme/Allergies: Positive for seasonal allergies.  positive for thyroid disease Psychiatric/Behavioral: Negative for  suicidal ideas and hallucinations.  positive for depression All other systems reviewed and are negative.   Physical Exam: Vitals:   07/20/16 0824  BP: 116/80  Pulse: 72   Body mass index is 36.96 kg/m. Gen:      No acute distress HEENT:  EOMI, sclera anicteric Neck:     No masses; no thyromegaly Lungs:    Clear to auscultation bilaterally; normal respiratory effort CV:         Regular rate and rhythm; no murmurs Abd:      + bowel sounds; soft, non-tender; no palpable masses, no distension Ext:    No edema; adequate peripheral perfusion Skin:      Warm and dry; no rash Neuro: alert and oriented x 3 Psych: normal mood and affect Rectal exam: Normal anal sphincter tone, no anal fissure or external hemorrhoids Anoscopy: Small internal hemorrhoids, no active bleeding, normal dentate line, no visible nodules  Data Reviewed:  Reviewed labs, radiology imaging, old records and pertinent past GI work up   Assessment and Plan/Recommendations:  31 year old female with history of Hashimoto's thyroid disease here with complaints of intermittent rectal bleeding, anal itching, constipation and abdominal pain  Rectal bleeding: Likely etiology hemorrhage from internal hemorrhoids but cannot exclude rectal neoplasm or inflammatory bowel disease She has family history of Crohn's disease in first-degree relative (Father) Schedule for colonoscopy for evaluation The risks and benefits as well as alternatives of endoscopic procedure(s) have been discussed and reviewed. All questions answered. The patient agrees to proceed.  Constipation: Increase dietary fiber and fluid intake Benefiber 1 tablespoon 3 times daily with meals  Abdominal pain: Her  symptoms are consistent with irritable bowel syndrome Patient is also at risk for small intestinal bacterial overgrowth given history of Hashimoto's and hypothyroidism with decreased GI motility We'll do a trial of peppermint oil one capsule 3 times daily as needed Avoid artificial sweeteners, high fructose corn syrup  Anal itching and fullness: Secondary to symptomatic hemorrhoids We'll consider hemorrhoidal band ligation if continues to have persistent symptoms after colonoscopy    Kirtland BouchardK. Scherry RanVeena Nandigam , MD [phone] Mon-Fri 8a-5p [phone] after 5p, weekends, holidays  CC: Rama, Maryruth Bunhristina P, MD

## 2016-07-25 ENCOUNTER — Encounter: Payer: Self-pay | Admitting: Gastroenterology

## 2016-08-08 ENCOUNTER — Encounter: Payer: BC Managed Care – PPO | Admitting: Gastroenterology

## 2016-08-08 ENCOUNTER — Telehealth: Payer: Self-pay | Admitting: Gastroenterology

## 2016-08-08 NOTE — Telephone Encounter (Signed)
ok 

## 2016-09-06 LAB — OB RESULTS CONSOLE GC/CHLAMYDIA
Chlamydia: NEGATIVE
GC PROBE AMP, GENITAL: NEGATIVE

## 2016-09-06 LAB — OB RESULTS CONSOLE HEPATITIS B SURFACE ANTIGEN: HEP B S AG: NEGATIVE

## 2016-09-06 LAB — OB RESULTS CONSOLE RUBELLA ANTIBODY, IGM: RUBELLA: IMMUNE

## 2016-09-06 LAB — OB RESULTS CONSOLE RPR: RPR: NONREACTIVE

## 2016-09-06 LAB — OB RESULTS CONSOLE HIV ANTIBODY (ROUTINE TESTING): HIV: NONREACTIVE

## 2016-10-26 ENCOUNTER — Other Ambulatory Visit: Payer: Self-pay | Admitting: Occupational Medicine

## 2016-10-26 ENCOUNTER — Ambulatory Visit: Payer: Self-pay

## 2016-10-26 DIAGNOSIS — M25571 Pain in right ankle and joints of right foot: Secondary | ICD-10-CM

## 2017-01-31 NOTE — L&D Delivery Note (Signed)
Delivery Note At 9:11 PM a viable female was delivered via Vaginal, Spontaneous (Presentation: ROA ;  ).  APGAR: 9, 9; weight pending .   Placenta status: routine, .  Cord: WNL,  with the following complications: none.  Cord pH: not sent  Anesthesia:   Episiotomy: None Lacerations: 2nd degree Suture Repair: 3.0 vicryl Est. Blood Loss (mL): 200   It's a girl - "Feliberto HartsJori Claire"!! Mom to postpartum.  Baby to Couplet care / Skin to Skin.  Ranae Pilalise Jennifer Moria Brophy 03/27/2017, 9:40 PM

## 2017-02-24 ENCOUNTER — Encounter (HOSPITAL_COMMUNITY): Payer: Self-pay | Admitting: *Deleted

## 2017-02-24 ENCOUNTER — Inpatient Hospital Stay (HOSPITAL_COMMUNITY)
Admission: AD | Admit: 2017-02-24 | Discharge: 2017-02-24 | Disposition: A | Discharge status: Home / Self Care | Payer: BC Managed Care – PPO | Source: Ambulatory Visit | Attending: Obstetrics & Gynecology | Admitting: Obstetrics & Gynecology

## 2017-02-24 ENCOUNTER — Other Ambulatory Visit: Payer: Self-pay

## 2017-02-24 DIAGNOSIS — Z3A34 34 weeks gestation of pregnancy: Secondary | ICD-10-CM | POA: Diagnosis not present

## 2017-02-24 DIAGNOSIS — T1490XA Injury, unspecified, initial encounter: Secondary | ICD-10-CM

## 2017-02-24 DIAGNOSIS — W19XXXA Unspecified fall, initial encounter: Secondary | ICD-10-CM | POA: Diagnosis not present

## 2017-02-24 DIAGNOSIS — Y9301 Activity, walking, marching and hiking: Secondary | ICD-10-CM | POA: Insufficient documentation

## 2017-02-24 DIAGNOSIS — O26893 Other specified pregnancy related conditions, third trimester: Secondary | ICD-10-CM | POA: Insufficient documentation

## 2017-02-24 DIAGNOSIS — M549 Dorsalgia, unspecified: Secondary | ICD-10-CM | POA: Diagnosis not present

## 2017-02-24 DIAGNOSIS — O9A213 Injury, poisoning and certain other consequences of external causes complicating pregnancy, third trimester: Secondary | ICD-10-CM | POA: Diagnosis not present

## 2017-02-24 DIAGNOSIS — W010XXA Fall on same level from slipping, tripping and stumbling without subsequent striking against object, initial encounter: Secondary | ICD-10-CM | POA: Diagnosis not present

## 2017-02-24 NOTE — MAU Provider Note (Signed)
History     CSN: [id number]  Arrival date and time: 02/24/17 1522   First Provider Initiated Contact with Patient 02/24/17 1639      Chief Complaint  Patient presents with  . 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   Denise Mccall is a 32 y.o. G2P1001 at 32w0dwho presents today after a fall. She states that around 1315 she tripped and fell. She landed on her mid-back and buttocks. She denies any VB or LOF. She reports normal fetal movement since the fall.    Fall  The accident occurred 3 to 6 hours ago. The fall occurred while walking. She fell from a height of 3 to 5 ft. She landed on hard floor. There was no blood loss. The point of impact was the buttocks. The pain is present in the back and buttocks. The pain is at a severity of 5/10. She has tried nothing for the symptoms.    Past Medical History:  Diagnosis Date  . Depression   . Thyroid disease     Past Surgical History:  Procedure Laterality Date  . KNEE ARTHROSCOPY W/ ACL RECONSTRUCTION  2001  . ORTHOPEDIC SURGERY      Family History  Problem Relation Age of Onset  . Hypothyroidism Mother   . Diabetes Paternal Grandmother   . Skin cancer Father   . Kidney cancer Father   . Heart disease Father   . Crohn's disease Father     Social History   Tobacco Use  . Smoking status: Never Smoker  . Smokeless tobacco: Never Used  Substance Use Topics  . Alcohol use: No    Alcohol/week: 0.0 oz    Frequency: Never  . Drug use: No    Allergies: No Known Allergies  Medications Prior to Admission  Medication Sig Dispense Refill Last Dose  . ibuprofen (ADVIL,MOTRIN) 200 MG tablet Take 200 mg by mouth every 6 (six) hours as needed.   Taking  . ipratropium (ATROVENT) 0.06 % nasal spray Place 2 sprays into both nostrils 4 (four) times daily. 15 mL 0 Taking  . Na Sulfate-K Sulfate-Mg Sulf 17.5-3.13-1.6 GM/180ML SOLN 1 suprep kit for colonoscopy prep 354 mL 0   . NP THYROID 15 MG tablet    Taking     Review of Systems Physical Exam   Blood pressure 126/68, pulse 64, temperature 98.3 F (36.8 C), temperature source Oral, resp. rate 17, height 5' 6"  (1.676 m), weight 248 lb (112.5 kg), SpO2 97 %, unknown if currently breastfeeding.  Physical Exam  Nursing note and vitals reviewed. Constitutional: She is oriented to person, place, and time. She appears well-developed and well-nourished. No distress.  HENT:  Head: Normocephalic.  Cardiovascular: Normal rate.  Respiratory: Effort normal.  GI: Soft. There is no tenderness. There is no rebound.  Neurological: She is alert and oriented to person, place, and time.  Skin: Skin is warm and dry.  6cm x3 cm bruise to her sacrum.   Psychiatric: She has a normal mood and affect.   FHT: 145, moderate with 15x15 accels, no decels Toco: some UI when first placed on the monitor. None noted for more than 2 hours now.    MAU Course  Procedures  MDM Patient offered ice and tylenol. She declines at this time.  1600: DW Dr. MLynnette Caffey ok for DC home after 4 hours of monitoring if tracing reactive and no contractions.    Assessment and Plan   1. Fall, initial encounter  2. [redacted] weeks gestation of pregnancy    DC home Comfort measures reviewed  3rd Trimester precautions  PTL precautions  Fetal kick counts RX: none  Return to MAU as needed FU with OB as planned  Follow-up Information    Morris, Megan, DO Follow up.   Specialty:  Obstetrics and Gynecology Contact information: [address], [place], [place] [address] [phone]            Marcille Buffy 02/24/2017, 4:42 PM

## 2017-02-24 NOTE — MAU Note (Signed)
Urine is in the Lab 

## 2017-02-24 NOTE — Discharge Instructions (Signed)

## 2017-02-24 NOTE — MAU Note (Signed)
Pt. Here due to fall at school (1315), landed on behind, sore, did not hit belly. Larey SeatFell over another student during a gym activity. Positive for fetal movement, denies sudden gush of fluid, and no vaginal bleeding. EFM applied -  FHR - 150s, Toco applied - abd. soft

## 2017-03-27 ENCOUNTER — Encounter (HOSPITAL_COMMUNITY): Payer: Self-pay | Admitting: Obstetrics and Gynecology

## 2017-03-27 ENCOUNTER — Inpatient Hospital Stay (HOSPITAL_COMMUNITY)
Admission: AD | Admit: 2017-03-27 | Discharge: 2017-03-29 | DRG: 807 | Disposition: A | Discharge status: Home / Self Care | Payer: BC Managed Care – PPO | Source: Ambulatory Visit | Attending: Obstetrics and Gynecology | Admitting: Obstetrics and Gynecology

## 2017-03-27 DIAGNOSIS — O99284 Endocrine, nutritional and metabolic diseases complicating childbirth: Secondary | ICD-10-CM | POA: Diagnosis present

## 2017-03-27 DIAGNOSIS — Z3A38 38 weeks gestation of pregnancy: Secondary | ICD-10-CM | POA: Diagnosis not present

## 2017-03-27 DIAGNOSIS — E039 Hypothyroidism, unspecified: Secondary | ICD-10-CM | POA: Diagnosis present

## 2017-03-27 DIAGNOSIS — Z3490 Encounter for supervision of normal pregnancy, unspecified, unspecified trimester: Secondary | ICD-10-CM

## 2017-03-27 DIAGNOSIS — Z3483 Encounter for supervision of other normal pregnancy, third trimester: Secondary | ICD-10-CM | POA: Diagnosis present

## 2017-03-27 HISTORY — DX: Hypothyroidism, unspecified: E03.9

## 2017-03-27 LAB — CBC
HCT: 36.2 % (ref 36.0–46.0)
Hemoglobin: 12 g/dL (ref 12.0–15.0)
MCH: 27.2 pg (ref 26.0–34.0)
MCHC: 33.1 g/dL (ref 30.0–36.0)
MCV: 82.1 fL (ref 78.0–100.0)
PLATELETS: 321 10*3/uL (ref 150–400)
RBC: 4.41 MIL/uL (ref 3.87–5.11)
RDW: 13.5 % (ref 11.5–15.5)
WBC: 15.7 10*3/uL — ABNORMAL HIGH (ref 4.0–10.5)

## 2017-03-27 LAB — TYPE AND SCREEN
ABO/RH(D): O POS
Antibody Screen: NEGATIVE

## 2017-03-27 LAB — OB RESULTS CONSOLE GBS: GBS: NEGATIVE

## 2017-03-27 MED ORDER — DIPHENHYDRAMINE HCL 50 MG/ML IJ SOLN: 12.5000 mg | INTRAMUSCULAR | Status: DC | PRN

## 2017-03-27 MED ORDER — SENNOSIDES-DOCUSATE SODIUM 8.6-50 MG PO TABS
2.0000 | ORAL_TABLET | ORAL | Status: DC
  Administered 2017-03-28 – 2017-03-29 (×2): 2 via ORAL
  Filled 2017-03-27 (×2): qty 2

## 2017-03-27 MED ORDER — IBUPROFEN 600 MG PO TABS
600.0000 mg | ORAL_TABLET | Freq: Four times a day (QID) | ORAL | Status: DC
  Administered 2017-03-27 – 2017-03-29 (×6): 600 mg via ORAL
  Filled 2017-03-27 (×7): qty 1

## 2017-03-27 MED ORDER — FENTANYL 2.5 MCG/ML BUPIVACAINE 1/10 % EPIDURAL INFUSION (WH - ANES)
INTRAMUSCULAR | Status: AC
  Filled 2017-03-27: qty 100

## 2017-03-27 MED ORDER — FENTANYL 2.5 MCG/ML BUPIVACAINE 1/10 % EPIDURAL INFUSION (WH - ANES): 14.0000 mL/h | INTRAMUSCULAR | Status: DC | PRN

## 2017-03-27 MED ORDER — ZOLPIDEM TARTRATE 5 MG PO TABS: 5.0000 mg | ORAL_TABLET | Freq: Every evening | ORAL | Status: DC | PRN

## 2017-03-27 MED ORDER — TETANUS-DIPHTH-ACELL PERTUSSIS 5-2.5-18.5 LF-MCG/0.5 IM SUSP: 0.5000 mL | Freq: Once | INTRAMUSCULAR | Status: DC

## 2017-03-27 MED ORDER — THYROID 30 MG PO TABS
30.0000 mg | ORAL_TABLET | Freq: Every day | ORAL | Status: DC
  Administered 2017-03-28 – 2017-03-29 (×2): 30 mg via ORAL
  Filled 2017-03-27 (×2): qty 1

## 2017-03-27 MED ORDER — LACTATED RINGERS IV SOLN: 500.0000 mL | Freq: Once | INTRAVENOUS | Status: DC

## 2017-03-27 MED ORDER — OXYCODONE HCL 5 MG PO TABS: 5.0000 mg | ORAL_TABLET | ORAL | Status: DC | PRN

## 2017-03-27 MED ORDER — LACTATED RINGERS IV SOLN: INTRAVENOUS | Status: DC

## 2017-03-27 MED ORDER — COCONUT OIL OIL: 1.0000 "application " | TOPICAL_OIL | Status: DC | PRN

## 2017-03-27 MED ORDER — OXYTOCIN 40 UNITS IN LACTATED RINGERS INFUSION - SIMPLE MED
2.5000 [IU]/h | INTRAVENOUS | Status: DC
  Administered 2017-03-27: 2.5 [IU]/h via INTRAVENOUS
  Filled 2017-03-27: qty 1000

## 2017-03-27 MED ORDER — ONDANSETRON HCL 4 MG PO TABS: 4.0000 mg | ORAL_TABLET | ORAL | Status: DC | PRN

## 2017-03-27 MED ORDER — THYROID 120 MG PO TABS
120.0000 mg | ORAL_TABLET | Freq: Every day | ORAL | Status: DC
  Administered 2017-03-28 – 2017-03-29 (×2): 120 mg via ORAL
  Filled 2017-03-27 (×2): qty 1

## 2017-03-27 MED ORDER — OXYCODONE-ACETAMINOPHEN 5-325 MG PO TABS: 2.0000 | ORAL_TABLET | ORAL | Status: DC | PRN

## 2017-03-27 MED ORDER — ACETAMINOPHEN 325 MG PO TABS
650.0000 mg | ORAL_TABLET | ORAL | Status: DC | PRN
  Administered 2017-03-28 – 2017-03-29 (×2): 650 mg via ORAL
  Filled 2017-03-27 (×2): qty 2

## 2017-03-27 MED ORDER — EPHEDRINE 5 MG/ML INJ
10.0000 mg | INTRAVENOUS | Status: DC | PRN
  Filled 2017-03-27: qty 2

## 2017-03-27 MED ORDER — ONDANSETRON HCL 4 MG/2ML IJ SOLN: 4.0000 mg | INTRAMUSCULAR | Status: DC | PRN

## 2017-03-27 MED ORDER — SIMETHICONE 80 MG PO CHEW
80.0000 mg | CHEWABLE_TABLET | ORAL | Status: DC | PRN
Start: 2017-03-27 — End: 2017-03-29

## 2017-03-27 MED ORDER — SOD CITRATE-CITRIC ACID 500-334 MG/5ML PO SOLN: 30.0000 mL | ORAL | Status: DC | PRN

## 2017-03-27 MED ORDER — LACTATED RINGERS IV SOLN: 500.0000 mL | INTRAVENOUS | Status: DC | PRN

## 2017-03-27 MED ORDER — OXYCODONE HCL 5 MG PO TABS: 10.0000 mg | ORAL_TABLET | ORAL | Status: DC | PRN

## 2017-03-27 MED ORDER — DIBUCAINE 1 % RE OINT: 1.0000 "application " | TOPICAL_OINTMENT | RECTAL | Status: DC | PRN

## 2017-03-27 MED ORDER — PHENYLEPHRINE 40 MCG/ML (10ML) SYRINGE FOR IV PUSH (FOR BLOOD PRESSURE SUPPORT)
PREFILLED_SYRINGE | INTRAVENOUS | Status: AC
  Filled 2017-03-27: qty 20

## 2017-03-27 MED ORDER — PRENATAL MULTIVITAMIN CH
1.0000 | ORAL_TABLET | Freq: Every day | ORAL | Status: DC
  Administered 2017-03-28 – 2017-03-29 (×2): 1 via ORAL
  Filled 2017-03-27 (×2): qty 1

## 2017-03-27 MED ORDER — PHENYLEPHRINE 40 MCG/ML (10ML) SYRINGE FOR IV PUSH (FOR BLOOD PRESSURE SUPPORT)
80.0000 ug | PREFILLED_SYRINGE | INTRAVENOUS | Status: DC | PRN
  Filled 2017-03-27: qty 5

## 2017-03-27 MED ORDER — ONDANSETRON HCL 4 MG/2ML IJ SOLN: 4.0000 mg | Freq: Four times a day (QID) | INTRAMUSCULAR | Status: DC | PRN

## 2017-03-27 MED ORDER — OXYTOCIN BOLUS FROM INFUSION
500.0000 mL | Freq: Once | INTRAVENOUS | Status: AC
  Administered 2017-03-27: 500 mL via INTRAVENOUS

## 2017-03-27 MED ORDER — BENZOCAINE-MENTHOL 20-0.5 % EX AERO
1.0000 "application " | INHALATION_SPRAY | CUTANEOUS | Status: DC | PRN
  Administered 2017-03-28: 1 via TOPICAL
  Filled 2017-03-27: qty 56

## 2017-03-27 MED ORDER — DIPHENHYDRAMINE HCL 25 MG PO CAPS: 25.0000 mg | ORAL_CAPSULE | Freq: Four times a day (QID) | ORAL | Status: DC | PRN

## 2017-03-27 MED ORDER — WITCH HAZEL-GLYCERIN EX PADS: 1.0000 "application " | MEDICATED_PAD | CUTANEOUS | Status: DC | PRN

## 2017-03-27 MED ORDER — LIDOCAINE HCL (PF) 1 % IJ SOLN
30.0000 mL | INTRAMUSCULAR | Status: DC | PRN
  Administered 2017-03-27: 30 mL via SUBCUTANEOUS
  Filled 2017-03-27: qty 30

## 2017-03-27 MED ORDER — ACETAMINOPHEN 325 MG PO TABS: 650.0000 mg | ORAL_TABLET | ORAL | Status: DC | PRN

## 2017-03-27 MED ORDER — OXYCODONE-ACETAMINOPHEN 5-325 MG PO TABS: 1.0000 | ORAL_TABLET | ORAL | Status: DC | PRN

## 2017-03-27 MED ORDER — SERTRALINE HCL 25 MG PO TABS
25.0000 mg | ORAL_TABLET | Freq: Every day | ORAL | Status: DC
  Administered 2017-03-28: 25 mg via ORAL
  Filled 2017-03-27: qty 1

## 2017-03-27 NOTE — H&P (Signed)
Denise Mccall is a 32 y.o. female presenting for labor. OB History    Gravida Para Term Preterm AB Living   2 1 1     1    SAB TAB Ectopic Multiple Live Births         0 1     Past Medical History:  Diagnosis Date  . Depression   . Hypothyroidism   . Thyroid disease    Past Surgical History:  Procedure Laterality Date  . KNEE ARTHROSCOPY W/ ACL RECONSTRUCTION  2001  . ORTHOPEDIC SURGERY     Family History: family history includes Crohn's disease in her father; Diabetes in her paternal grandmother; Heart disease in her father; Hypothyroidism in her mother; Kidney cancer in her father; Skin cancer in her father. Social History:  reports that  has never smoked. she has never used smokeless tobacco. She reports that she does not drink alcohol or use drugs.     Maternal Diabetes: No Genetic Screening: Normal Maternal Ultrasounds/Referrals: Normal Fetal Ultrasounds or other Referrals:  None Maternal Substance Abuse:  No Significant Maternal Medications:  None Significant Maternal Lab Results:  None Other Comments:  None  ROS History Dilation: 6.5 Effacement (%): 90 Station: -1 Exam by:: EchoStarSavannah BRendle RN Blood pressure (!) 150/72, pulse 67, resp. rate 20, unknown if currently breastfeeding. Exam Physical Exam   NAD, A&O NWOB Abd soft, nondistended, gravid SVE above  Prenatal labs: ABO, Rh:   Antibody:   Rubella: Immune (08/07 0000) RPR: Nonreactive (08/07 0000)  HBsAg: Negative (08/07 0000)  HIV: Non-reactive (08/07 0000)  GBS: Negative (02/25 0000)   Assessment/Plan: 31yo G2P1001 @38 .3wga presenting in labor. H/o hypothyroidism c/w synthroid. PPD w/last pregnancy. AROM after CLE and plan expectant manag't for now. Female infant. GBS neg.     Madelaine Etiennelise Jennifer Aubreyana Saltz 03/27/2017, 9:02 PM

## 2017-03-28 LAB — CBC
HEMATOCRIT: 31.9 % — AB (ref 36.0–46.0)
Hemoglobin: 10.5 g/dL — ABNORMAL LOW (ref 12.0–15.0)
MCH: 27.1 pg (ref 26.0–34.0)
MCHC: 32.9 g/dL (ref 30.0–36.0)
MCV: 82.2 fL (ref 78.0–100.0)
Platelets: 299 10*3/uL (ref 150–400)
RBC: 3.88 MIL/uL (ref 3.87–5.11)
RDW: 13.6 % (ref 11.5–15.5)
WBC: 17.5 10*3/uL — AB (ref 4.0–10.5)

## 2017-03-28 LAB — RPR: RPR Ser Ql: NONREACTIVE

## 2017-03-28 MED ORDER — BENZONATATE 100 MG PO CAPS
100.0000 mg | ORAL_CAPSULE | Freq: Three times a day (TID) | ORAL | Status: DC
  Administered 2017-03-28 – 2017-03-29 (×2): 100 mg via ORAL
  Filled 2017-03-28 (×2): qty 1

## 2017-03-28 NOTE — Progress Notes (Signed)
Post Partum Day 1 Subjective: no complaints, up ad lib, voiding and tolerating PO  Objective: Blood pressure 132/63, pulse (!) 55, temperature (!) 97.5 F (36.4 C), temperature source Oral, resp. rate 20, unknown if currently breastfeeding.  Physical Exam:  General: alert, cooperative and no distress Lochia: appropriate Uterine Fundus: firm Incision: healing well DVT Evaluation: No evidence of DVT seen on physical exam.  Recent Labs    03/27/17 2039 03/28/17 0516  HGB 12.0 10.5*  HCT 36.2 31.9*    Assessment/Plan: Plan for discharge tomorrow   LOS: 1 day   Roselle LocusJames E Kaydence Menard II 03/28/2017, 8:37 AM

## 2017-03-28 NOTE — Lactation Note (Addendum)
This note was copied from a baby's chart. Lactation Consultation Note Baby 4 hrs old. Mom stated her 1st child she had a lot of issues, baby had some issues and Bf was harder. This baby latched right away and has fed great 2 times.  Mom has short shaft very compressible nipples. Has colostrum.  Encouraged STS and I&O. Mom encouraged to feed baby 8-12 times/24 hours and with feeding cues.  Mom didn't act like she wanted or needed lactation at this time. Encouraged to call for assistance or questions. WH/LC brochure given w/resources, support groups and LC services.  Patient Name: Denise Mccall Date: 03/28/2017 Reason for consult: Initial assessment   Maternal Data Has patient been taught Hand Expression?: Yes Does the patient have breastfeeding experience prior to this delivery?: Yes  Feeding Feeding Type: Breast Fed  LATCH Score Latch: Too sleepy or reluctant, no latch achieved, no sucking elicited.  Audible Swallowing: None  Type of Nipple: Everted at rest and after stimulation  Comfort (Breast/Nipple): Soft / non-tender  Hold (Positioning): Assistance needed to correctly position infant at breast and maintain latch.  LATCH Score: 5  Interventions Interventions: Breast feeding basics reviewed  Lactation Tools Discussed/Used WIC Program: No   Consult Status Consult Status: Follow-up Date: 03/29/17 Follow-up type: In-patient    Kostantinos Tallman, Diamond NickelLAURA G 03/28/2017, 1:47 AM

## 2017-03-29 ENCOUNTER — Encounter (HOSPITAL_COMMUNITY): Payer: Self-pay | Admitting: *Deleted

## 2017-03-29 MED ORDER — IBUPROFEN 600 MG PO TABS: 600.0000 mg | ORAL_TABLET | Freq: Four times a day (QID) | ORAL | 0 refills | Status: DC

## 2017-03-29 NOTE — Discharge Summary (Signed)
Obstetric Discharge Summary Reason for Admission: onset of labor Prenatal Procedures: none Intrapartum Procedures: spontaneous vaginal delivery Postpartum Procedures: none Complications-Operative and Postpartum: 2 degree perineal laceration Hemoglobin  Date Value Ref Range Status  03/28/2017 10.5 (L) 12.0 - 15.0 g/dL Final   HCT  Date Value Ref Range Status  03/28/2017 31.9 (L) 36.0 - 46.0 % Final    Physical Exam:  General: alert, cooperative, appears stated age and no distress Lochia: appropriate Uterine Fundus: firm Incision: healing well DVT Evaluation: No evidence of DVT seen on physical exam.  Discharge Diagnoses: Term Pregnancy-delivered  Discharge Information: Date: 03/29/2017 Activity: pelvic rest Diet: routine Medications: Ibuprofen Condition: stable Instructions: refer to practice specific booklet Discharge to: home   Newborn Data: Live born female  Birth Weight: 6 lb 6.5 oz (2905 g) APGAR: 9, 9  Newborn Delivery   Birth date/time:  03/[birth year]/2017 21:11:00 Delivery type:  Vaginal, Spontaneous     Home with mother.  Tyresha Fede C 03/29/2017, 9:22 AM

## 2017-03-29 NOTE — Lactation Note (Signed)
This note was copied from a baby's chart. Lactation Consultation Note Baby 32 hrs old. Mom's 2nd baby. Mom states baby is BF well. Feels confident in going home.  Mom has LARGE breast and led/large nipples. Baby latches well.  Encouraged to cont. I&O, STS after d/c home. Engorgement, filling, milk storage, mom stated she knows all about it. Reminded of resources LC OP. Mom stated she came to support groups every Tuesday w/her son.  Mom has no questions or concerns. Encouraged to call if she does. Baby latched well at this time BF. Discussed using support while BF.  Patient Name: Girl Domenica Reamermanda Froberg JXBJY'NToday's Date: 03/29/2017 Reason for consult: Follow-up assessment   Maternal Data    Feeding Feeding Type: Breast Fed Length of feed: (still bf)  LATCH Score Latch: Grasps breast easily, tongue down, lips flanged, rhythmical sucking.  Audible Swallowing: A few with stimulation  Type of Nipple: Everted at rest and after stimulation  Comfort (Breast/Nipple): Soft / non-tender  Hold (Positioning): No assistance needed to correctly position infant at breast.  LATCH Score: 9  Interventions Interventions: Breast feeding basics reviewed  Lactation Tools Discussed/Used     Consult Status Consult Status: Complete Date: 03/29/17    Charyl DancerCARVER, Dastan Krider G 03/29/2017, 5:38 AM

## 2017-04-19 ENCOUNTER — Ambulatory Visit: Payer: Self-pay

## 2017-04-19 NOTE — Lactation Note (Signed)
This note was copied from a baby's chart. 04/19/2017  Name: Denise Mccall MRN: [id number] Date of Birth: 03/27/2018 Gestational Age: Gestational Age: [redacted]w[redacted]d Birth Weight: 102.5 oz Weight today:   7 pounds 1.5 ounces (3218 grams) with clean newborn diaper   Denise Mccall has gained 454 grams in the last 21 days. Infant with an average daily weight gain of 22 grams a day.   Denise Mccall presents today with mom as mom is experiencing nipple pain with latch.   Infant with a labial frenulum that inserts near the bottom of the gum ridge. Infant with posterior lingual frenulum noted. Tongue alternates with retraction and extension. She has good tongue lateralization. She is noted to have limited mid tongue elevation. Infant has difficulty organizing suck and often has a poor seal. She has difficulty staying latched at the breast and clicks throughout feeding. Mom reports infant clicks on the bottle and drools with the bottle at times. Mom was given information on Tongue/Lip tie web sites and local providers.  Mom was given information on suck training to start 1-2 day post revision.    Mom is pumping 3-4 x a day and getting several ounces of milk per pumping. She is storing a lot of milk. Mom is giving 2-3 bottles in 24 hours with the Dr. Theora Gianotti Level 1 nipple. Mom reports she does drool at times. Enc mom to use the Dr. Theora Gianotti Preemie nipple as needed.   Mom latched infant to the left breast in the cross cradle hold. Infant initially frantic and on and off the breast. We tried and # 20 NS and infant did not do as well with the feeding. Mom then took the NS off and infant did go back and nursed better. Nipple was asymmetrical post BF. Mom with pain with feeding without the NS, no pain with the NS. Infant transferred 26 ml with NS on and 54 ml without the NS.   Mom to follow up with Lactation 1-2 days post tongue/lip revisions and prn. Infant with follow up Ped appt on April 5th. Mom has been attending BF Support  Groups.    General Information: Mother's reason for visit: nipple pain with feeding Consult: Initial Lactation consultant: Noralee Stain RN,IBCLC Breastfeeding experience: pain wtih feeding throughout feeding Maternal medical conditions: Thyroid, History post partum depression(Hypothyroid-NP Thyroid) Maternal medications: Pre-natal vitamin  Breastfeeding History: Frequency of breast feeding: 10 x a day Duration of feeding: 10-20 minutes  Supplementation: Supplement method: bottle(Dr. Brown's Level 1 nipple)         Breast milk volume: 3 ounces Breast milk frequency: 3 x a day in the bottle and BF   Pump type: Medela pump in style Pump frequency: 3-4 x a day Pump volume: 10 ounces a day  Infant Output Assessment: Voids per 24 hours: 6+ Urine color: Clear yellow Stools per 24 hours: 6+ Stool color: Yellow  Breast Assessment: Breast: Filling     Pain interventions: Bra  Feeding Assessment: Infant oral assessment: Variance Infant oral assessment comment: Infant with a labial frenulum that inserts near the bottom of the gum ridge. Infant with posterior lingual frenulum noted. Tongue alternates with retraction and extension. She has good tongue lateralization. She is noted to have limited mid tongue elevation. Infant has difficulty organizing suck and often has a poor seal. She has difficulty staying latched at the breast and clicks throughout feeding. Mom reports infant clicks on the bottle and drools with the bottle at times.  Positioning: Forensic psychologist: 1 - Repeated  attempts needed to sustain latch, nipple held in mouth throughout feeding, stimulation needed to elicit sucking reflex. Audible swallowing: 1 - A few with stimulation Type of nipple: 2 - Everted at rest and after stimulation Comfort: 2 - Soft/non-tender Hold: 2 - No assistance needed to correctly position infant at breast LATCH score: 8 Latch assessment: Shallow Lips flanged: Yes Suck assessment:  Displays both Tools: Nipple shield 20 mm Pre-feed weight: 3218 grams Post feed weight: 3244 grams Amount transferred: 26 ml Amount supplemented: 0  Additional Feeding Assessment: Infant oral assessment: Variance Infant oral assessment comment: see above Positioning: Cross cradle Latch: 2 - Grasps breast easily, tongue down, lips flanged, rhythmical sucking. Audible swallowing: 2 - Spontaneous and intermittent Type of nipple: 2 - Everted at rest and after stimulation Comfort: 1 - Filling, red/small blisters or bruises, mild/mod discomfort Hold: 2 - No assistance needed to correctly position infant at breast LATCH score: 9 Latch assessment: Deep Lips flanged: Yes Suck assessment: Displays both   Pre-feed weight: 3244 grams Post feed weight: 3298 grams Amount transferred: 54 ml Amount supplemented: 0  Totals: Total amount transferred: 80 ml Total supplement given: 0 Total amount pumped post feed: 0   Plan:  1. Feed infant at the breast with first feeding cues 2. Suck training prior to each feeding by allowing infant to suck on your pinkie and gently tug out.  3. Keep infant awake at the breast 4. Massage/compress with feedings 5. Use the # 20 nipple shield with feedings 6. Empty one breast before offering the second breast 7. Continue pumping 3-4 x a day post BF for 10-20 minutes to protect milk supply 8. Keep up the good work 9. Call with any questions/concerns as needed [phone] 10. Thank you for allowing me to assist you and Denise Mccall today 11. Follow up with Lactation 1-2 days post revisions if done or as needed    Ed BlalockSharon S Lilliah Priego RN, IBCLC                                                        Silas FloodSharon S Priti Consoli 04/19/2017, 8:50 AM

## 2017-05-12 ENCOUNTER — Ambulatory Visit: Payer: Self-pay

## 2017-05-12 NOTE — Lactation Note (Signed)
This note was copied from a baby's chart.  05/12/2017  Name: Denise Mccall MRN: [id number] Date of Birth: 03/27/2018 Gestational Age: Gestational Age: 3966w3d Birth Weight: 102.5 oz Weight today:    8 pounds 9.8 ounces (3908 grams) naked  Denise Mccall has gained 708 grams in the last 23 days with an average daily weight gain of 31 grams a day.   Denise Mccall has her tongue and lip revised on Monday 4/8 by Dr. Orland MustardMcMurtry. She will follow up with him on 4/24.   Mom reports infant is tender with her tongue stretches. She is not as bothered by her lip stretches. Mom reports infant is still hurting her with feeding but the pain is less than prior to procedure. Infant still clicks on the breast and bottle. Infant is choking less and drooling less on the bottle.  Mom is pumping 3 x a day and getting 8-10 ounces a pumping. Mom is able to supplement infant and store some. Mom has 400 ounces in the freezer. Infant will take bottles about 5 x a day with Dr. Theora GianottiBrown's bottle. Infant takes 3-4 ounces in a bottle. She will take less after breast feeding.   Mom latched infant to the left breast in the cross cradle hold. Infant with consistent clicking on the breast. Infant with cheek dimpling at the breast. Infant pulled on and off the breast some. Infant fed for about 15 minutes and transferred 44 ml. Infant content post feeding.   Infant with healing incisions to lip and tongue. Tongue with diamond shape incision under tongue with granulation tissue noted. Infant with good tongue extension at rest. Infant tends to pull tongue back behind gumline with suckling on gloved finger. Infant with good tongue lateralization. Infant with tongue thrusting on gloved finger. Infant with high palate. Infant with cheek dimpling while at the breast.  Infant gags on finger a lot with oral manipulation. Infant tends to curl lip in at the breast, they are more flanged on the bottle per mom. Mom given suck training exercises to perform 6-8  times in a day. Discussed with mom that infant may take a few weeks to relearn use of her tongue.   Infant to follow up with Dr. Orland MustardMcMurtry on 4/24. Infant to follow up with Dr. Alita ChyleBrassfield on May 8th. Mom attends BF Support Groups. Mom to follow up with Lactation as needed.   Mom reports all questions/concerns have been answered.    General Information: Mother's reason for visit: Post tongue tie/lip tie revision Consult: Initial Lactation consultant: Noralee StainSharon Norrine Ballester RN,IBCLC Breastfeeding experience: pops on and off, nipple still painful Maternal medical conditions: Thyroid, History post partum depression Maternal medications: Pre-natal vitamin  Breastfeeding History: Frequency of breast feeding: 10 x a day Duration of feeding: 10-25 minutes  Supplementation: Supplement method: bottle(Dr. Brown's )         Breast milk volume: 3-4 ounces Breast milk frequency: 5 x a day with bottle   Pump type: Medela pump in style Pump frequency: 2-3 x a day Pump volume: 8-12 ounces  Infant Output Assessment: Voids per 24 hours: 6+ Urine color: Clear yellow Stools per 24 hours: 6+ Stool color: Yellow  Breast Assessment: Breast: Filling Nipple: Erect   Pain interventions: Bra  Feeding Assessment: Infant oral assessment: Variance Infant oral assessment comment: Infant with healing incisions to lip and tongue. Tongue with diamond shape incision under tongue with granulation tissue noted. Infant with good tongue extension at rest. Infant tends to pull tongue back behind gumline with suckling  on gloved finger. Infant with good tongue lateralization. Infant with tongue thrusting on gloved finger. Infant with high palate. Infant gags on finger a lot with oral manipulation. Mom given suck training exercises to perform 6-8 times in a day.  Positioning: Forensic psychologist: 1 - Repeated attempts needed to sustain latch, nipple held in mouth throughout feeding, stimulation needed to elicit sucking  reflex. Audible swallowing: 2 - Spontaneous and intermittent Type of nipple: 2 - Everted at rest and after stimulation Comfort: 2 - Soft/non-tender Hold: 2 - No assistance needed to correctly position infant at breast LATCH score: 9 Latch assessment: Shallow Lips flanged: Yes(needs upper lip flanging with feeding) Suck assessment: Nutritive   Pre-feed weight: 3926 grams Post feed weight: 3970 grams Amount transferred: 44 ml Amount supplemented: 0  Additional Feeding Assessment:                                    Totals: Total amount transferred: 44 ml Total supplement given: 0 Total amount pumped post feed: 0    Plan: 1. Offer the breast as mom and infant want 2. Have her finish one breast before offering the second 3. Offer bottle to infant with Expressed breast milk as infant needs 4. Denise Mccall needs about 73-98 ml (2.3-3.5 ounces) with 8 feedings a day 5. Continue pumping as you are to protect milk supply 6. Continue stretches as Dr. Orland Mustard prescribed 7. Suck Training 6-8 x a day before feeding 8. Keep up the good work 9. Thank you for allowing me to assist you today 10. Call for assistance as needed [phone] 11. Follow up with Lactation as needed  Ed Blalock RN, [employer]

## 2017-12-19 ENCOUNTER — Encounter (HOSPITAL_COMMUNITY): Payer: Self-pay | Admitting: Emergency Medicine

## 2017-12-19 ENCOUNTER — Ambulatory Visit (INDEPENDENT_AMBULATORY_CARE_PROVIDER_SITE_OTHER): Payer: BC Managed Care – PPO

## 2017-12-19 ENCOUNTER — Ambulatory Visit (HOSPITAL_COMMUNITY)
Admission: EM | Admit: 2017-12-19 | Discharge: 2017-12-19 | Disposition: A | Discharge status: Home / Self Care | Payer: BC Managed Care – PPO | Attending: Family Medicine | Admitting: Family Medicine

## 2017-12-19 ENCOUNTER — Other Ambulatory Visit: Payer: Self-pay

## 2017-12-19 DIAGNOSIS — R1084 Generalized abdominal pain: Secondary | ICD-10-CM | POA: Diagnosis not present

## 2017-12-19 DIAGNOSIS — R109 Unspecified abdominal pain: Secondary | ICD-10-CM | POA: Diagnosis not present

## 2017-12-19 LAB — POCT URINALYSIS DIP (DEVICE)
GLUCOSE, UA: NEGATIVE mg/dL
HGB URINE DIPSTICK: NEGATIVE
LEUKOCYTES UA: NEGATIVE
NITRITE: NEGATIVE
Protein, ur: 100 mg/dL — AB
Specific Gravity, Urine: 1.025 (ref 1.005–1.030)
UROBILINOGEN UA: 0.2 mg/dL (ref 0.0–1.0)
pH: 6.5 (ref 5.0–8.0)

## 2017-12-19 LAB — POCT PREGNANCY, URINE: Preg Test, Ur: NEGATIVE

## 2017-12-19 MED ORDER — ONDANSETRON 4 MG PO TBDP
4.0000 mg | ORAL_TABLET | Freq: Once | ORAL | Status: AC
  Administered 2017-12-19: 4 mg via ORAL

## 2017-12-19 MED ORDER — ONDANSETRON 4 MG PO TBDP
ORAL_TABLET | ORAL | Status: AC
  Filled 2017-12-19: qty 1

## 2017-12-19 MED ORDER — ONDANSETRON 4 MG PO TBDP: 4.0000 mg | ORAL_TABLET | Freq: Three times a day (TID) | ORAL | 0 refills | Status: DC | PRN

## 2017-12-19 NOTE — ED Triage Notes (Signed)
Has felt bad for 1 1/2 weeks.  Last Monday felt worse, since Thursday unable to eat-abdominal pain and poor appetite.  Has had some diarrhea, but this worsened significantly last night.  Patient has dry heaves and frequent diarrhea.  Patient went to a walk in clinic yesterday. Clinic related symptoms to thyroid.  Fever 101 yesterday.  Patient's back pain in lower back.  Denies urinary symptoms

## 2017-12-19 NOTE — Discharge Instructions (Signed)
Your urine was negative for infection or pregnancy It did reveal some slight dehydration.  Make sure you are staying hydrated and drinking sips of Gatorade and water. I will give you some medicine for nausea, vomiting to help This could be a number of things from a viral illness to some sort of gallbladder or intestinal issue. I do not believe it is anything that warrants going to the emergency room today. If the symptoms continue you may want to follow-up with your primary care for further imaging to include ultrasound or CT scan The symptoms worsen and become more severe he will need to go the ER

## 2017-12-19 NOTE — ED Provider Notes (Signed)
[facility]    CSN: [id number] Arrival date & time: 12/19/17  0806     History   Chief Complaint Chief Complaint  Patient presents with  . Abdominal Pain    HPI Denise Mccall is a 32 y.o. female.   Patient is a 32 year old female with past medical history of hypothyroidism and depression.  She presents for approximately 1 week or more of generalized abdominal cramping, nausea, diarrhea, low-grade fever, back pain.  Her symptoms have been constant and remain the same.  She has not taken anything for symptoms.  She has had loss of appetite, slight dizziness.  She denies any urinary symptoms to include frequency, dysuria, hematuria.  She has not had a menstrual period since August of 2017 when she had her child.  She is no longer breast-feeding.  She denies any vaginal symptoms.  She denies any recent sick contacts, recent traveling or recent antibiotic use.    Abdominal Pain    Past Medical History:  Diagnosis Date  . Depression   . Hypothyroidism   . Thyroid disease     Patient Active Problem List   Diagnosis Date Noted  . Constipation 07/20/2016  . Blood per rectum 07/20/2016  . Vaginal delivery 09/10/2015  . Pregnancy 09/09/2015  . 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 KNEE ARTHROSCOPY W/ ACL RECONSTRUCTION  2001  . ORTHOPEDIC SURGERY      OB History    Gravida  2   Para  1   Term  1   Preterm      AB      Living  1     SAB      TAB      Ectopic      Multiple  0   Live Births  1            Home Medications    Prior to Admission medications   Medication Sig Start Date End Date Taking? Authorizing Provider  NP THYROID 120 MG tablet Take 120 mg by mouth daily. Take with 30mg  tab 01/12/17  Yes [provider]  ibuprofen (ADVIL,MOTRIN) 600 MG tablet Take 1 tablet (600 mg total) by mouth every 6 (six) hours. 03/29/17   Candice CampLowe, David, MD  NP THYROID 30 MG  tablet Take 30 mg by mouth daily. Take with 120 mg tab 01/12/17   [provider]  ondansetron (ZOFRAN ODT) 4 MG disintegrating tablet Take 1 tablet (4 mg total) by mouth every 8 (eight) hours as needed for nausea or vomiting. 12/19/17   Janace ArisBast, Jhalil Silvera A, NP  Prenatal Vit-Fe Fumarate-FA (PRENATAL MULTIVITAMIN) TABS tablet Take 1 tablet by mouth daily at 12 noon.    [provider]  sertraline (ZOLOFT) 25 MG tablet Take 25 mg by mouth daily. 01/19/17   [provider]    Family History Family History  Problem Relation Age of Onset  . Hypothyroidism Mother   . Diabetes Paternal Grandmother   . Skin cancer Father   . Kidney cancer Father   . Heart disease Father   . Crohn's disease Father     Social History Social History   Tobacco Use  . Smoking status: Never Smoker  . Smokeless tobacco: Never Used  Substance Use Topics  . Alcohol use: No    Alcohol/week: 0.0 standard drinks    Frequency: Never  . Drug use: No     Allergies   Patient has no known  allergies.   Review of Systems Review of Systems  Gastrointestinal: Positive for abdominal pain.     Physical Exam Triage Vital Signs ED Triage Vitals  Enc Vitals Group     BP 12/19/17 0834 99/63     Pulse Rate 12/19/17 0834 98     Resp 12/19/17 0834 16     Temp 12/19/17 0834 99 F (37.2 C)     Temp Source 12/19/17 0834 Oral     SpO2 12/19/17 0834 96 %     Weight --      Height --      Head Circumference --      Peak Flow --      Pain Score 12/19/17 0838 8     Pain Loc --      Pain Edu? --      Excl. in GC? --    No data found.  Updated Vital Signs BP 99/63 (BP Location: Right Arm) Comment: reported BP to Nurse Selena Batten Lapan Hutchens  Pulse 98   Temp 99 F (37.2 C) (Oral)   Resp 16   LMP 06/18/2016 Comment: had a baby in 03/2017.  last period in may 2018  SpO2 96%   Visual Acuity Right Eye Distance:   Left Eye Distance:   Bilateral Distance:    Right Eye Near:   Left Eye Near:      Bilateral Near:     Physical Exam  Constitutional: She appears well-developed and well-nourished.  Non-toxic appearance. She does not appear ill.  HENT:  Head: Normocephalic and atraumatic.  Pulmonary/Chest: Effort normal.  Abdominal: Soft. Normal appearance and bowel sounds are normal. There is generalized tenderness and tenderness in the right upper quadrant, epigastric area and left upper quadrant. There is CVA tenderness. There is no rebound and negative Murphy's sign.  More in the upper abdomen.   Neurological: She is alert.  Skin: Skin is warm and dry.  Psychiatric: She has a normal mood and affect.  Nursing note and vitals reviewed.    UC Treatments / Results  Labs (all labs ordered are listed, but only abnormal results are displayed) Labs Reviewed  POCT URINALYSIS DIP (DEVICE) - Abnormal; Notable for the following components:      Result Value   Bilirubin Urine MODERATE (*)    Ketones, ur TRACE (*)    Protein, ur 100 (*)    All other components within normal limits  POCT PREGNANCY, URINE    EKG None  Radiology Dg Abd 2 Views  Result Date: 12/19/2017 CLINICAL DATA:  Abdominal pain EXAM: ABDOMEN - 2 VIEW COMPARISON:  None. FINDINGS: Small rounded calcification in the right lower pelvis, likely phlebolith. There is normal bowel gas pattern. No free air. No organomegaly or suspicious calcification. No acute bony abnormality. IMPRESSION: No acute findings. Electronically Signed   By: Charlett Nose M.D.   On: 12/19/2017 09:21    Procedures Procedures (including critical care time)  Medications Ordered in UC Medications  ondansetron (ZOFRAN-ODT) disintegrating tablet 4 mg (4 mg Oral Given 12/19/17 0933)    Initial Impression / Assessment and Plan / UC Course  I have reviewed the triage vital signs and the nursing notes.  Pertinent labs & imaging results that were available during my care of the patient were reviewed by me and considered in my medical decision  making (see chart for details).     Patient is a 32 year old female that presents for generalized abdominal discomfort, body aches, low-grade fever, nausea, diarrhea that is  been present for approximately a week. Today vital signs are stable, she is nontoxic or ill-appearing Zofran given for nausea in clinic. Urine revealed slight dehydration but was negative for infection or pregnancy X-ray did not reveal any abnormalities   Differential include, cholelithiasis, peptic ulcer, duodenal ulcer, viral illness Viral illness and chole more likely.  I will prescribe her some nausea medicine to take at home as needed Instructed to stay hydrated and advance diet as tolerated If her symptoms continue or she develops worsening symptoms please go to the ER for further management.  Final Clinical Impressions(s) / UC Diagnoses   Final diagnoses:  Generalized abdominal pain     Discharge Instructions     Your urine was negative for infection or pregnancy It did reveal some slight dehydration.  Make sure you are staying hydrated and drinking sips of Gatorade and water. I will give you some medicine for nausea, vomiting to help This could be a number of things from a viral illness to some sort of gallbladder or intestinal issue. I do not believe it is anything that warrants going to the emergency room today. If the symptoms continue you may want to follow-up with your primary care for further imaging to include ultrasound or CT scan The symptoms worsen and become more severe he will need to go the ER    ED Prescriptions    Medication Sig Dispense Auth. Provider   ondansetron (ZOFRAN ODT) 4 MG disintegrating tablet Take 1 tablet (4 mg total) by mouth every 8 (eight) hours as needed for nausea or vomiting. 20 tablet Dahlia Byes A, NP     Controlled Substance Prescriptions [place] Controlled Substance Registry consulted? Not Applicable   Janace Aris, NP 12/19/17 1001

## 2018-02-27 ENCOUNTER — Other Ambulatory Visit: Payer: Self-pay | Admitting: Occupational Medicine

## 2018-02-27 MED ORDER — HYDROCODONE-ACETAMINOPHEN 5-325 MG PO TABS: 2.0000 | ORAL_TABLET | Freq: Four times a day (QID) | ORAL | 0 refills | Status: AC | PRN

## 2018-08-01 ENCOUNTER — Other Ambulatory Visit: Payer: Self-pay | Admitting: *Deleted

## 2018-08-01 DIAGNOSIS — Z20822 Contact with and (suspected) exposure to covid-19: Secondary | ICD-10-CM

## 2018-08-05 LAB — NOVEL CORONAVIRUS, NAA: SARS-CoV-2, NAA: NOT DETECTED

## 2018-08-09 ENCOUNTER — Telehealth: Payer: Self-pay

## 2018-08-09 NOTE — Telephone Encounter (Signed)
Pt. Given COVID 19 results, verbalizes understanding. 

## 2018-12-17 ENCOUNTER — Other Ambulatory Visit: Payer: Self-pay

## 2018-12-17 DIAGNOSIS — Z20822 Contact with and (suspected) exposure to covid-19: Secondary | ICD-10-CM

## 2018-12-19 LAB — NOVEL CORONAVIRUS, NAA: SARS-CoV-2, NAA: NOT DETECTED

## 2019-03-04 ENCOUNTER — Ambulatory Visit: Payer: BC Managed Care – PPO | Attending: Internal Medicine

## 2019-03-04 DIAGNOSIS — Z20822 Contact with and (suspected) exposure to covid-19: Secondary | ICD-10-CM

## 2019-03-05 LAB — NOVEL CORONAVIRUS, NAA: SARS-CoV-2, NAA: NOT DETECTED

## 2020-02-21 ENCOUNTER — Other Ambulatory Visit: Payer: Self-pay

## 2020-02-21 DIAGNOSIS — Z20822 Contact with and (suspected) exposure to covid-19: Secondary | ICD-10-CM

## 2020-02-22 LAB — SARS-COV-2, NAA 2 DAY TAT

## 2020-02-22 LAB — NOVEL CORONAVIRUS, NAA: SARS-CoV-2, NAA: DETECTED — AB

## 2020-04-24 ENCOUNTER — Ambulatory Visit: Payer: Self-pay | Admitting: Physician Assistant

## 2021-01-04 ENCOUNTER — Ambulatory Visit: Payer: BC Managed Care – PPO | Admitting: Family

## 2021-01-04 ENCOUNTER — Other Ambulatory Visit: Payer: Self-pay

## 2021-01-04 ENCOUNTER — Encounter: Payer: Self-pay | Admitting: Family

## 2021-01-04 VITALS — BP 128/84 | HR 64 | Temp 98.3°F | Ht 66.0 in | Wt 246.4 lb

## 2021-01-04 DIAGNOSIS — E669 Obesity, unspecified: Secondary | ICD-10-CM | POA: Diagnosis not present

## 2021-01-04 DIAGNOSIS — E063 Autoimmune thyroiditis: Secondary | ICD-10-CM | POA: Diagnosis not present

## 2021-01-04 MED ORDER — NP THYROID 30 MG PO TABS: 30.0000 mg | ORAL_TABLET | Freq: Every day | ORAL | 0 refills | Status: DC

## 2021-01-04 MED ORDER — NP THYROID 120 MG PO TABS: 120.0000 mg | ORAL_TABLET | Freq: Every day | ORAL | 0 refills | Status: DC

## 2021-01-04 NOTE — Patient Instructions (Signed)
Welcome to [facility]! It was a pleasure meeting you today.  Please go to the lab for blood work today. A referral has been sent to your endocrinologist.  PLEASE NOTE:  If you had any LAB tests please let us know if you have not heard back within a few days. You may see your results on MyChart before we have a chance to review them but we will give you a call once they are reviewed by [place]. If we ordered any REFERRALS today, please let us know if you have not heard from their office within the next week.  Let us know through MyChart if you are needing REFILLS, or have your pharmacy send [place] the request. You can also use MyChart to communicate with me or any office staff.  Please try these tips to maintain a healthy lifestyle:  Eat most of your calories during the day when you are active. Eliminate processed foods including packaged sweets (pies, cakes, cookies), reduce intake of potatoes, white bread, white pasta, and white rice. Look for whole grain options, oat flour or almond flour.  Each meal should contain half fruits/vegetables, one quarter protein, and one quarter carbs (no bigger than a computer mouse).  Cut down on sweet beverages. This includes juice, soda, and sweet tea. Also watch fruit intake, though this is a healthier sweet option, it still contains natural sugar! Limit to 3 servings daily.  Drink at least 1 glass of water with each meal and aim for at least 8 glasses per day  Exercise at least 150 minutes every week.

## 2021-01-04 NOTE — Progress Notes (Signed)
New Patient Office Visit  Subjective:  Patient ID: Denise Mccall, female    DOB: [birth year]  Age: 35 y.o. MRN: [id number]  CC:  Chief Complaint  Patient presents with   Establish Care   Referral    Endocrinologist   Hypothyroidism    HPI [name] presents for establishing care. She would like to discuss one chronic problem. Hypothyroidism: Patient presents today for followup of Hypothyroidism.  Patient reports positive compliance with daily medication: NP Thyroid, but did run out 2 weeks ago. Patient denies any of the following symptoms: fatigue, cold intolerance, constipation, weight gain or inability to lose weight, muscle weakness, mental slowing, dry hair and skin. Last TSH and free T4:      Lab Results  Component Value Date    FREE T4 0.62 01/05/2015    TSH 3.59 01/05/2015    TSH 1.48 11/13/2014     Past Medical History:  Diagnosis Date   Depression    Hypothyroidism    Thyroid disease     Past Surgical History:  Procedure Laterality Date   KNEE ARTHROSCOPY W/ ACL RECONSTRUCTION  2001   ORTHOPEDIC SURGERY      Family History  Problem Relation Age of Onset   Hypothyroidism Mother    Diabetes Paternal Grandmother    Skin cancer Father    Kidney cancer Father    Heart disease Father    Crohn's disease Father     Social History   Socioeconomic History   Marital status: Single    Spouse name: Not on file   Number of children: 1   Years of education: Not on file   Highest education level: Not on file  Occupational History   Not on file  Tobacco Use   Smoking status: Never   Smokeless tobacco: Never  Vaping Use   Vaping Use: Never used  Substance and Sexual Activity   Alcohol use: Yes    Alcohol/week: 3.0 standard drinks    Types: 3 Standard drinks or equivalent per week   Drug use: No   Sexual activity: Yes    Birth control/protection: Condom  Other Topics Concern   Not on file  Social History Narrative   Not on file   Social  Determinants of Health   Financial Resource Strain: Not on file  Food Insecurity: Not on file  Transportation Needs: Not on file  Physical Activity: Not on file  Stress: Not on file  Social Connections: Not on file  Intimate Partner Violence: Not on file    Objective:   Today's Vitals: BP 128/84   Pulse 64   Temp 98.3 F (36.8 C) (Temporal)   Ht 5\' 6"  (1.676 m)   Wt 246 lb 6.4 oz (111.8 kg)   LMP 12/13/2020 (Exact Date)   SpO2 98%   BMI 39.77 kg/m   Physical Exam Vitals and nursing note reviewed.  Constitutional:      Appearance: Normal appearance. She is obese.  Cardiovascular:     Rate and Rhythm: Normal rate and regular rhythm.  Pulmonary:     Effort: Pulmonary effort is normal.     Breath sounds: Normal breath sounds.  Musculoskeletal:        General: Normal range of motion.  Skin:    General: Skin is warm and dry.  Neurological:     Mental Status: She is alert.  Psychiatric:        Mood and Affect: Mood normal.        Behavior: Behavior  normal.    Assessment & Plan:   Problem List Items Addressed This Visit       Endocrine   Acquired autoimmune hypothyroidism    Ran out of medication about 2 weeks ago, was established with Dr. Chalmers Cater and would like to reestablish care with her. Checking labs today and sending refill of med.      Relevant Medications   NP THYROID 120 MG tablet   NP THYROID 30 MG tablet   Other Relevant Orders   Ambulatory referral to Endocrinology   T4, free   TSH   T3, free     Other   Obesity (BMI 30-39.9) - Primary    Wt. Loss strategies reviewed including portion control, less carbs including sweets, eating most of calories earlier in day, drinking 64oz water qd, and establishing daily exercise routine.       Outpatient Encounter Medications as of 01/04/2021  Medication Sig   NP THYROID 120 MG tablet Take 1 tablet (120 mg total) by mouth daily. Take with 30mg  tab   NP THYROID 30 MG tablet Take 1 tablet (30 mg total) by  mouth daily. Take with 120 mg tab   [DISCONTINUED] NP THYROID 120 MG tablet Take 120 mg by mouth daily. Take with 30mg  tab   [DISCONTINUED] NP THYROID 30 MG tablet Take 30 mg by mouth daily. Take with 120 mg tab   [DISCONTINUED] ibuprofen (ADVIL,MOTRIN) 600 MG tablet Take 1 tablet (600 mg total) by mouth every 6 (six) hours.   [DISCONTINUED] ondansetron (ZOFRAN ODT) 4 MG disintegrating tablet Take 1 tablet (4 mg total) by mouth every 8 (eight) hours as needed for nausea or vomiting.   [DISCONTINUED] Prenatal Vit-Fe Fumarate-FA (PRENATAL MULTIVITAMIN) TABS tablet Take 1 tablet by mouth daily at 12 noon.   [DISCONTINUED] sertraline (ZOLOFT) 25 MG tablet Take 25 mg by mouth daily.   No facility-administered encounter medications on file as of 01/04/2021.    Follow-up: Return for if labs not done at endocrinology, Complete physical w/fasting labs.   Jeanie Sewer, NP

## 2021-01-04 NOTE — Assessment & Plan Note (Addendum)
Ran out of medication about 2 weeks ago, was established with Dr. Talmage Nap and would like to reestablish care with her. Checking labs today and sending refill of med.

## 2021-01-04 NOTE — Assessment & Plan Note (Signed)
Wt. Loss strategies reviewed including portion control, less carbs including sweets, eating most of calories earlier in day, drinking 64oz water qd, and establishing daily exercise routine. °

## 2021-01-05 LAB — TSH: TSH: 5.6 u[IU]/mL — ABNORMAL HIGH (ref 0.35–5.50)

## 2021-01-05 LAB — T4, FREE: Free T4: 0.54 ng/dL — ABNORMAL LOW (ref 0.60–1.60)

## 2021-01-05 LAB — T3, FREE: T3, Free: 3.3 pg/mL (ref 2.3–4.2)

## 2021-01-06 NOTE — Progress Notes (Signed)
Thyroid level low as expected without medication. Resuming daily dose will improve numbers. Follow up with Endocrinology.  Thanks

## 2021-03-08 ENCOUNTER — Other Ambulatory Visit: Payer: Self-pay

## 2021-03-08 ENCOUNTER — Emergency Department (HOSPITAL_COMMUNITY): Payer: BC Managed Care – PPO

## 2021-03-08 ENCOUNTER — Observation Stay (HOSPITAL_COMMUNITY)
Admission: EM | Admit: 2021-03-08 | Discharge: 2021-03-09 | Disposition: A | Discharge status: Home / Self Care | Payer: BC Managed Care – PPO | Attending: Surgery | Admitting: Surgery

## 2021-03-08 ENCOUNTER — Encounter (HOSPITAL_COMMUNITY): Payer: Self-pay

## 2021-03-08 DIAGNOSIS — Z79899 Other long term (current) drug therapy: Secondary | ICD-10-CM | POA: Diagnosis not present

## 2021-03-08 DIAGNOSIS — K353 Acute appendicitis with localized peritonitis, without perforation or gangrene: Principal | ICD-10-CM | POA: Insufficient documentation

## 2021-03-08 DIAGNOSIS — R1031 Right lower quadrant pain: Secondary | ICD-10-CM | POA: Diagnosis present

## 2021-03-08 DIAGNOSIS — E039 Hypothyroidism, unspecified: Secondary | ICD-10-CM | POA: Insufficient documentation

## 2021-03-08 DIAGNOSIS — Z20822 Contact with and (suspected) exposure to covid-19: Secondary | ICD-10-CM | POA: Insufficient documentation

## 2021-03-08 DIAGNOSIS — K3580 Unspecified acute appendicitis: Secondary | ICD-10-CM | POA: Diagnosis present

## 2021-03-08 LAB — CBC
HCT: 41.2 % (ref 36.0–46.0)
Hemoglobin: 13.1 g/dL (ref 12.0–15.0)
MCH: 27.5 pg (ref 26.0–34.0)
MCHC: 31.8 g/dL (ref 30.0–36.0)
MCV: 86.4 fL (ref 80.0–100.0)
Platelets: 298 10*3/uL (ref 150–400)
RBC: 4.77 MIL/uL (ref 3.87–5.11)
RDW: 12.3 % (ref 11.5–15.5)
WBC: 12 10*3/uL — ABNORMAL HIGH (ref 4.0–10.5)
nRBC: 0 % (ref 0.0–0.2)

## 2021-03-08 LAB — COMPREHENSIVE METABOLIC PANEL
ALT: 22 U/L (ref 0–44)
AST: 19 U/L (ref 15–41)
Albumin: 4 g/dL (ref 3.5–5.0)
Alkaline Phosphatase: 81 U/L (ref 38–126)
Anion gap: 8 (ref 5–15)
BUN: 8 mg/dL (ref 6–20)
CO2: 26 mmol/L (ref 22–32)
Calcium: 9.6 mg/dL (ref 8.9–10.3)
Chloride: 105 mmol/L (ref 98–111)
Creatinine, Ser: 0.93 mg/dL (ref 0.44–1.00)
GFR, Estimated: >60 mL/min (ref >60)
Glucose, Bld: 123 mg/dL — ABNORMAL HIGH (ref 70–99)
Potassium: 3.5 mmol/L (ref 3.5–5.1)
Sodium: 139 mmol/L (ref 135–145)
Total Bilirubin: 0.3 mg/dL (ref 0.3–1.2)
Total Protein: 7.2 g/dL (ref 6.5–8.1)

## 2021-03-08 LAB — I-STAT BETA HCG BLOOD, ED (MC, WL, AP ONLY): I-stat hCG, quantitative: <5 m[IU]/mL (ref <5)

## 2021-03-08 LAB — RESP PANEL BY RT-PCR (FLU A&B, COVID) ARPGX2
Influenza A by PCR: NEGATIVE
Influenza B by PCR: NEGATIVE
SARS Coronavirus 2 by RT PCR: NEGATIVE

## 2021-03-08 LAB — URINALYSIS, ROUTINE W REFLEX MICROSCOPIC
Bilirubin Urine: NEGATIVE
Glucose, UA: NEGATIVE mg/dL
Hgb urine dipstick: NEGATIVE
Ketones, ur: NEGATIVE mg/dL
Leukocytes,Ua: NEGATIVE
Nitrite: NEGATIVE
Protein, ur: NEGATIVE mg/dL
Specific Gravity, Urine: 1.027 (ref 1.005–1.030)
pH: 5 (ref 5.0–8.0)

## 2021-03-08 LAB — LIPASE, BLOOD: Lipase: 25 U/L (ref 11–51)

## 2021-03-08 MED ORDER — ONDANSETRON 4 MG PO TBDP
4.0000 mg | ORAL_TABLET | Freq: Once | ORAL | Status: AC | PRN
  Administered 2021-03-08: 4 mg via ORAL
  Filled 2021-03-08: qty 1

## 2021-03-08 MED ORDER — MORPHINE SULFATE (PF) 4 MG/ML IV SOLN
4.0000 mg | Freq: Once | INTRAVENOUS | Status: AC
  Administered 2021-03-09: 4 mg via INTRAVENOUS
  Filled 2021-03-08: qty 1

## 2021-03-08 MED ORDER — ONDANSETRON HCL 4 MG/2ML IJ SOLN
4.0000 mg | Freq: Once | INTRAMUSCULAR | Status: AC
  Administered 2021-03-09: 4 mg via INTRAVENOUS
  Filled 2021-03-08: qty 2

## 2021-03-08 MED ORDER — IOHEXOL 300 MG/ML  SOLN
100.0000 mL | Freq: Once | INTRAMUSCULAR | Status: AC | PRN
  Administered 2021-03-08: 100 mL via INTRAVENOUS

## 2021-03-08 MED ORDER — SODIUM CHLORIDE 0.9 % IV SOLN
1.0000 g | Freq: Once | INTRAVENOUS | Status: DC
  Filled 2021-03-08: qty 10

## 2021-03-08 MED ORDER — METRONIDAZOLE 500 MG/100ML IV SOLN
500.0000 mg | Freq: Two times a day (BID) | INTRAVENOUS | Status: DC
  Administered 2021-03-09: 500 mg via INTRAVENOUS
  Filled 2021-03-08: qty 100

## 2021-03-08 NOTE — ED Provider Notes (Signed)
[facility] EMERGENCY DEPARTMENT Provider Note   CSN: [id number] Arrival date & time: 03/08/21  1656     History  Chief Complaint  Patient presents with   Abdominal Pain    Denise Mccall is a 36 y.o. female.  HPI     This is a 36 year old with a history of hypothyroidism who presents with abdominal pain.  Patient reports she has not felt well for several days but yesterday began to have some abdominal pain.  It started in the upper abdomen and now is "everywhere."  However, it has localized more to the right lower quadrant.  She has had some nausea vomiting.  No change in bowel movements.  No fevers.  She has never had pain like this before.  Nothing seems to make it better or worse.  Rates her pain at 10 out of 10.  Denies urinary symptoms.  Home Medications Prior to Admission medications   Medication Sig Start Date End Date Taking? Authorizing Provider  NP THYROID 120 MG tablet Take 1 tablet (120 mg total) by mouth daily. Take with 30mg  tab 01/04/21   Dulce Sellar, NP  NP THYROID 30 MG tablet Take 1 tablet (30 mg total) by mouth daily. Take with 120 mg tab 01/04/21   Dulce Sellar, NP      Allergies    Patient has no known allergies.    Review of Systems   Review of Systems  Constitutional:  Negative for fever.  Gastrointestinal:  Positive for abdominal pain, nausea and vomiting.  All other systems reviewed and are negative.  Physical Exam Updated Vital Signs BP 126/75 (BP Location: Right Arm)    Pulse 69    Temp 98.1 F (36.7 C) (Oral)    Resp (!) 22    Ht 1.676 m (5\' 6" )    Wt 104.3 kg    SpO2 100%    BMI 37.12 kg/m  Physical Exam Vitals and nursing note reviewed.  Constitutional:      Appearance: She is well-developed. She is obese. She is not ill-appearing.  HENT:     Head: Normocephalic and atraumatic.  Eyes:     Pupils: Pupils are equal, round, and reactive to light.  Cardiovascular:     Rate and Rhythm: Normal rate and regular  rhythm.     Heart sounds: Normal heart sounds.  Pulmonary:     Effort: Pulmonary effort is normal. No respiratory distress.     Breath sounds: No wheezing.  Abdominal:     General: Bowel sounds are normal.     Palpations: Abdomen is soft.     Tenderness: There is abdominal tenderness in the right lower quadrant.     Comments: Right lower quadrant tenderness to palpation with voluntary guarding, positive Rovsing's  Musculoskeletal:     Cervical back: Neck supple.  Skin:    General: Skin is warm and dry.  Neurological:     Mental Status: She is alert and oriented to person, place, and time.  Psychiatric:        Mood and Affect: Mood normal.    ED Results / Procedures / Treatments   Labs (all labs ordered are listed, but only abnormal results are displayed) Labs Reviewed  COMPREHENSIVE METABOLIC PANEL - Abnormal; Notable for the following components:      Result Value   Glucose, Bld 123 (*)    All other components within normal limits  CBC - Abnormal; Notable for the following components:   WBC 12.0 (*)  All other components within normal limits  URINALYSIS, ROUTINE W REFLEX MICROSCOPIC - Abnormal; Notable for the following components:   APPearance HAZY (*)    All other components within normal limits  RESP PANEL BY RT-PCR (FLU A&B, COVID) ARPGX2  LIPASE, BLOOD  I-STAT BETA HCG BLOOD, ED ([facility], [facility], [facility] ONLY)    EKG None  Radiology CT Abdomen Pelvis W Contrast  Result Date: 03/08/2021 CLINICAL DATA:  Right lower quadrant pain. EXAM: CT ABDOMEN AND PELVIS WITH CONTRAST TECHNIQUE: Multidetector CT imaging of the abdomen and pelvis was performed using the standard protocol following bolus administration of intravenous contrast. RADIATION DOSE REDUCTION: This exam was performed according to the departmental dose-optimization program which includes automated exposure control, adjustment of the mA and/or kV according to patient size and/or use of iterative reconstruction technique.  CONTRAST:  OMNIPAQUE IOHEXOL 300 MG/ML  SOLN COMPARISON:  None. FINDINGS: Lower chest: No acute abnormality. Hepatobiliary: No focal liver abnormality is seen. No gallstones, gallbladder wall thickening, or biliary dilatation. Pancreas: Unremarkable. No pancreatic ductal dilatation or surrounding inflammatory changes. Spleen: Normal in size without focal abnormality. Adrenals/Urinary Tract: Adrenal glands are unremarkable. Kidneys are normal, without renal calculi, focal lesion, or hydronephrosis. Bladder is unremarkable. Stomach/Bowel: Appendix is dilated and fluid-filled measuring 1 cm in diameter. There is mild surrounding inflammatory stranding. No evidence for perforation or abscess. No evidence for bowel obstruction. There is sigmoid colon diverticulosis without evidence for diverticulitis. Stomach and small bowel are within normal limits. Vascular/Lymphatic: No significant vascular findings are present. No enlarged abdominal or pelvic lymph nodes. Reproductive: Uterus and bilateral adnexa are unremarkable. Other: There is a small fat containing paraumbilical hernia. There is no ascites or free air. Musculoskeletal: No acute or significant osseous findings. IMPRESSION: 1. Acute uncomplicated appendicitis. 2. Sigmoid colon diverticulosis. Electronically Signed   By: Darliss Cheney M.D.   On: 03/08/2021 22:41    Procedures Procedures    Medications Ordered in ED Medications  cefTRIAXone (ROCEPHIN) 1 g in sodium chloride 0.9 % 100 mL IVPB (has no administration in time range)  metroNIDAZOLE (FLAGYL) IVPB 500 mg (has no administration in time range)  morphine (PF) 4 MG/ML injection 4 mg (has no administration in time range)  ondansetron (ZOFRAN) injection 4 mg (has no administration in time range)  ondansetron (ZOFRAN-ODT) disintegrating tablet 4 mg (4 mg Oral Given 03/08/21 1735)  iohexol (OMNIPAQUE) 300 MG/ML solution 100 mL (100 mLs Intravenous Contrast Given 03/08/21 2232)    ED Course/  Medical Decision Making/ A&P                           Medical Decision Making Risk Prescription drug management. Decision regarding hospitalization.   This patient presents to the ED for concern of abdominal pain, this involves an extensive number of treatment options, and is a complaint that carries with it a high risk of complications and morbidity.  The differential diagnosis includes appendicitis, gastritis, gastroenteritis, less likely SBO, UTI, kidney stone  MDM:    This is a 36 year old female who presents with abdominal pain.  She is nontoxic-appearing vital signs are reassuring.  She is afebrile.  She has localized peritonitis to the right lower quadrant.  History and physical exam is most suggestive of likely appendicitis.  Other etiologies including UTI are also a consideration.  Patient given pain and nausea medication.  Labs reviewed from triage.  Patient with leukocytosis to 12.  Urine pregnancy negative.  CT scan reviewed and  shows an uncomplicated appendicitis.  This fits her clinical picture.  She is NPO.  She was given Rocephin and Flagyl.  She was also given morphine and Zosyn.  General surgery consulted.  Discussed with Dr. Bedelia Person who will see the patient. (Labs, imaging)  Labs: I Ordered, and personally interpreted labs.  The pertinent results include: Leukocytosis  Imaging Studies ordered: I ordered imaging studies including CT abdomen with uncomplicated appendicitis I independently visualized and interpreted imaging. I agree with the radiologist interpretation  Additional history obtained from patient.  External records from outside source obtained and reviewed including prior visits  Critical Interventions: IV morphine, Rocephin, Flagyl  Consultations: I requested consultation with the general surgery,  and discussed lab and imaging findings as well as pertinent plan - they recommend: Admission  Cardiac Monitoring: The patient was maintained on a cardiac  monitor.  I personally viewed and interpreted the cardiac monitored which showed an underlying rhythm of: N/A  Reevaluation: After the interventions noted above, I reevaluated the patient and found that they have :improved   Considered admission for: Acute appendicitis  Social Determinants of Health: Lives independently  Disposition: Admit  Co morbidities that complicate the patient evaluation  Past Medical History:  Diagnosis Date   Depression    Hypothyroidism    Thyroid disease      Medicines Meds ordered this encounter  Medications   ondansetron (ZOFRAN-ODT) disintegrating tablet 4 mg   iohexol (OMNIPAQUE) 300 MG/ML solution 100 mL   cefTRIAXone (ROCEPHIN) 1 g in sodium chloride 0.9 % 100 mL IVPB    Order Specific Question:   Antibiotic Indication:    Answer:   Intra-abdominal   metroNIDAZOLE (FLAGYL) IVPB 500 mg    Order Specific Question:   Antibiotic Indication:    Answer:   Intra-abdominal Infection   morphine (PF) 4 MG/ML injection 4 mg   ondansetron (ZOFRAN) injection 4 mg    I have reviewed the patients home medicines and have made adjustments as needed  Problem List / ED Course: Problem List Items Addressed This Visit   None Visit Diagnoses     Acute appendicitis with localized peritonitis, unspecified whether abscess present, unspecified whether gangrene present, unspecified whether perforation present    -  Primary                   Final Clinical Impression(s) / ED Diagnoses Final diagnoses:  Acute appendicitis with localized peritonitis, unspecified whether abscess present, unspecified whether gangrene present, unspecified whether perforation present    Rx / DC Orders ED Discharge Orders     None         Shon Baton, MD 03/08/21 2342

## 2021-03-08 NOTE — ED Notes (Signed)
ED Provider at bedside. 

## 2021-03-08 NOTE — ED Triage Notes (Signed)
Pt arrives POV for eval of abd pain, worse in RLQ and LUQ onset yesterday afternoon. Endorses ongoing nausea, uncomfortable in triage

## 2021-03-08 NOTE — ED Provider Triage Note (Signed)
Emergency Medicine Provider Triage Evaluation Note  Denise Mccall , a 36 y.o. female  was evaluated in triage.  Pt complains of abdominal pain.  Lower abdominal pain.  Last BM was noon and was normal for her.  No urinary symptoms.  No fevers.  When it started was more upper abdominal.  No change in pain with bm  No sick contacts.  No abnormal discharge. LMP started January 17 and was normal for her.   No prior abdominal surgeries.   Physical Exam  BP (!) 142/77    Pulse 67    Temp 98 F (36.7 C) (Oral)    Resp 16    Ht 5\' 6"  (1.676 m)    Wt 104.3 kg    SpO2 98%    BMI 37.12 kg/m  Gen:   Awake, no distress   Resp:  Normal effort  MSK:   Moves extremities without difficulty  Other:  Normal speech.  RLQ TTP.   Medical Decision Making  Medically screening exam initiated at 7:33 PM.  Appropriate orders placed.  Denise Mccall was informed that the remainder of the evaluation will be completed by another provider, this initial triage assessment does not replace that evaluation, and the importance of remaining in the ED until their evaluation is complete.  Abdomen is generally tender, significantly worse in the right lower quadrant. CT scan is ordered.  COVID test is ordered in case patient needs to go to the OR.  NPO.   Lorin Glass, [place] 03/08/21 1944

## 2021-03-09 ENCOUNTER — Observation Stay (HOSPITAL_COMMUNITY): Payer: BC Managed Care – PPO | Admitting: Anesthesiology

## 2021-03-09 ENCOUNTER — Encounter (HOSPITAL_COMMUNITY)
Admission: EM | Disposition: A | Discharge status: Home / Self Care | Payer: Self-pay | Source: Home / Self Care | Attending: Emergency Medicine

## 2021-03-09 ENCOUNTER — Other Ambulatory Visit: Payer: Self-pay

## 2021-03-09 ENCOUNTER — Encounter (HOSPITAL_COMMUNITY): Payer: Self-pay

## 2021-03-09 DIAGNOSIS — K3580 Unspecified acute appendicitis: Secondary | ICD-10-CM | POA: Diagnosis present

## 2021-03-09 HISTORY — PX: LAPAROSCOPIC APPENDECTOMY: SHX408

## 2021-03-09 LAB — HIV ANTIBODY (ROUTINE TESTING W REFLEX): HIV Screen 4th Generation wRfx: NONREACTIVE

## 2021-03-09 SURGERY — APPENDECTOMY, LAPAROSCOPIC
Anesthesia: General | Site: Abdomen

## 2021-03-09 MED ORDER — FENTANYL CITRATE (PF) 250 MCG/5ML IJ SOLN
INTRAMUSCULAR | Status: AC
  Filled 2021-03-09: qty 5

## 2021-03-09 MED ORDER — ACETAMINOPHEN 500 MG PO TABS
1000.0000 mg | ORAL_TABLET | Freq: Four times a day (QID) | ORAL | Status: DC
  Administered 2021-03-09: 1000 mg via ORAL
  Filled 2021-03-09: qty 2

## 2021-03-09 MED ORDER — FENTANYL CITRATE (PF) 100 MCG/2ML IJ SOLN: 25.0000 ug | INTRAMUSCULAR | Status: DC | PRN

## 2021-03-09 MED ORDER — IBUPROFEN 600 MG PO TABS: 600.0000 mg | ORAL_TABLET | Freq: Four times a day (QID) | ORAL | 1 refills | Status: AC | PRN

## 2021-03-09 MED ORDER — SODIUM CHLORIDE 0.9 % IV SOLN
2.0000 g | INTRAVENOUS | Status: DC
  Administered 2021-03-09: 2 g via INTRAVENOUS
  Filled 2021-03-09: qty 20

## 2021-03-09 MED ORDER — MIDAZOLAM HCL 2 MG/2ML IJ SOLN
INTRAMUSCULAR | Status: AC
  Filled 2021-03-09: qty 2

## 2021-03-09 MED ORDER — METHOCARBAMOL 500 MG PO TABS
1000.0000 mg | ORAL_TABLET | Freq: Three times a day (TID) | ORAL | Status: DC
  Administered 2021-03-09: 1000 mg via ORAL
  Filled 2021-03-09 (×2): qty 2

## 2021-03-09 MED ORDER — SUGAMMADEX SODIUM 200 MG/2ML IV SOLN
INTRAVENOUS | Status: DC | PRN
  Administered 2021-03-09: 200 mg via INTRAVENOUS

## 2021-03-09 MED ORDER — OXYCODONE HCL 5 MG PO TABS: 5.0000 mg | ORAL_TABLET | ORAL | 0 refills | Status: AC | PRN

## 2021-03-09 MED ORDER — BUPIVACAINE-EPINEPHRINE 0.25% -1:200000 IJ SOLN
INTRAMUSCULAR | Status: DC | PRN
  Administered 2021-03-09: 30 mL

## 2021-03-09 MED ORDER — DEXAMETHASONE SODIUM PHOSPHATE 10 MG/ML IJ SOLN
INTRAMUSCULAR | Status: AC
  Filled 2021-03-09: qty 1

## 2021-03-09 MED ORDER — ONDANSETRON HCL 4 MG/2ML IJ SOLN: 4.0000 mg | Freq: Four times a day (QID) | INTRAMUSCULAR | Status: DC | PRN

## 2021-03-09 MED ORDER — MORPHINE SULFATE (PF) 2 MG/ML IV SOLN
2.0000 mg | INTRAVENOUS | Status: DC | PRN
  Administered 2021-03-09: 2 mg via INTRAVENOUS
  Administered 2021-03-09: 4 mg via INTRAVENOUS
  Filled 2021-03-09: qty 2
  Filled 2021-03-09: qty 1

## 2021-03-09 MED ORDER — KETOROLAC TROMETHAMINE 30 MG/ML IJ SOLN: 30.0000 mg | Freq: Four times a day (QID) | INTRAMUSCULAR | Status: DC

## 2021-03-09 MED ORDER — OXYCODONE HCL 5 MG PO TABS: 5.0000 mg | ORAL_TABLET | Freq: Once | ORAL | Status: DC | PRN

## 2021-03-09 MED ORDER — LACTATED RINGERS IV SOLN: INTRAVENOUS | Status: DC | PRN

## 2021-03-09 MED ORDER — DOCUSATE SODIUM 100 MG PO CAPS: 100.0000 mg | ORAL_CAPSULE | Freq: Two times a day (BID) | ORAL | Status: DC

## 2021-03-09 MED ORDER — ONDANSETRON HCL 4 MG/2ML IJ SOLN
INTRAMUSCULAR | Status: DC | PRN
  Administered 2021-03-09: 4 mg via INTRAVENOUS

## 2021-03-09 MED ORDER — OXYCODONE HCL 5 MG PO TABS: 5.0000 mg | ORAL_TABLET | ORAL | Status: DC | PRN

## 2021-03-09 MED ORDER — BUPIVACAINE HCL (PF) 0.25 % IJ SOLN
INTRAMUSCULAR | Status: AC
  Filled 2021-03-09: qty 30

## 2021-03-09 MED ORDER — METRONIDAZOLE 500 MG/100ML IV SOLN: 500.0000 mg | Freq: Two times a day (BID) | INTRAVENOUS | Status: DC

## 2021-03-09 MED ORDER — ONDANSETRON 4 MG PO TBDP: 4.0000 mg | ORAL_TABLET | Freq: Four times a day (QID) | ORAL | Status: DC | PRN

## 2021-03-09 MED ORDER — LIDOCAINE HCL (CARDIAC) PF 100 MG/5ML IV SOSY
PREFILLED_SYRINGE | INTRAVENOUS | Status: DC | PRN
  Administered 2021-03-09: 60 mg via INTRATRACHEAL

## 2021-03-09 MED ORDER — PROPOFOL 10 MG/ML IV BOLUS
INTRAVENOUS | Status: AC
  Filled 2021-03-09: qty 20

## 2021-03-09 MED ORDER — SUCCINYLCHOLINE CHLORIDE 200 MG/10ML IV SOSY
PREFILLED_SYRINGE | INTRAVENOUS | Status: DC | PRN
  Administered 2021-03-09: 120 mg via INTRAVENOUS

## 2021-03-09 MED ORDER — PROPOFOL 10 MG/ML IV BOLUS
INTRAVENOUS | Status: DC | PRN
  Administered 2021-03-09: 200 mg via INTRAVENOUS

## 2021-03-09 MED ORDER — FENTANYL CITRATE (PF) 250 MCG/5ML IJ SOLN
INTRAMUSCULAR | Status: DC | PRN
Start: 2021-03-09 — End: 2021-03-09
  Administered 2021-03-09 (×2): 50 ug via INTRAVENOUS
  Administered 2021-03-09: 100 ug via INTRAVENOUS
  Administered 2021-03-09: 50 ug via INTRAVENOUS

## 2021-03-09 MED ORDER — DOCUSATE SODIUM 100 MG PO CAPS: 100.0000 mg | ORAL_CAPSULE | Freq: Two times a day (BID) | ORAL | 2 refills | Status: DC

## 2021-03-09 MED ORDER — METHOCARBAMOL 750 MG PO TABS: 750.0000 mg | ORAL_TABLET | Freq: Four times a day (QID) | ORAL | 1 refills | Status: DC

## 2021-03-09 MED ORDER — ENOXAPARIN SODIUM 40 MG/0.4ML IJ SOSY: 40.0000 mg | PREFILLED_SYRINGE | INTRAMUSCULAR | Status: DC

## 2021-03-09 MED ORDER — 0.9 % SODIUM CHLORIDE (POUR BTL) OPTIME
TOPICAL | Status: DC | PRN
  Administered 2021-03-09: 1000 mL

## 2021-03-09 MED ORDER — OXYCODONE HCL 5 MG/5ML PO SOLN: 5.0000 mg | Freq: Once | ORAL | Status: DC | PRN

## 2021-03-09 MED ORDER — DEXAMETHASONE SODIUM PHOSPHATE 10 MG/ML IJ SOLN
INTRAMUSCULAR | Status: DC | PRN
  Administered 2021-03-09: 8 mg via INTRAVENOUS

## 2021-03-09 MED ORDER — MIDAZOLAM HCL 5 MG/5ML IJ SOLN
INTRAMUSCULAR | Status: DC | PRN
  Administered 2021-03-09: 2 mg via INTRAVENOUS

## 2021-03-09 SURGICAL SUPPLY — 47 items
APPLIER CLIP ROT 10 11.4 M/L (STAPLE)
BAG COUNTER SPONGE SURGICOUNT (BAG) ×2 IMPLANT
BLADE CLIPPER SURG (BLADE) IMPLANT
CANISTER SUCT 3000ML PPV (MISCELLANEOUS) IMPLANT
CHLORAPREP W/TINT 26 (MISCELLANEOUS) ×2 IMPLANT
CLIP APPLIE ROT 10 11.4 M/L (STAPLE) IMPLANT
COVER SURGICAL LIGHT HANDLE (MISCELLANEOUS) ×2 IMPLANT
CUTTER FLEX LINEAR 45M (STAPLE) ×2 IMPLANT
DERMABOND ADHESIVE PROPEN (GAUZE/BANDAGES/DRESSINGS) ×1
DERMABOND ADVANCED (GAUZE/BANDAGES/DRESSINGS) ×1
DERMABOND ADVANCED .7 DNX12 (GAUZE/BANDAGES/DRESSINGS) IMPLANT
DERMABOND ADVANCED .7 DNX6 (GAUZE/BANDAGES/DRESSINGS) ×1 IMPLANT
ELECT CAUTERY BLADE 6.4 (BLADE) ×2 IMPLANT
ELECT REM PT RETURN 9FT ADLT (ELECTROSURGICAL) ×2
ELECTRODE REM PT RTRN 9FT ADLT (ELECTROSURGICAL) ×1 IMPLANT
ENDOLOOP SUT PDS II  0 18 (SUTURE)
ENDOLOOP SUT PDS II 0 18 (SUTURE) IMPLANT
GLOVE SURG ENC MOIS LTX SZ6.5 (GLOVE) ×2 IMPLANT
GLOVE SURG UNDER POLY LF SZ6 (GLOVE) ×2 IMPLANT
GOWN STRL REUS W/ TWL LRG LVL3 (GOWN DISPOSABLE) ×3 IMPLANT
GOWN STRL REUS W/TWL LRG LVL3 (GOWN DISPOSABLE) ×1
KIT BASIN OR (CUSTOM PROCEDURE TRAY) ×2 IMPLANT
KIT TURNOVER KIT B (KITS) ×2 IMPLANT
NDL INSUFFLATION 14GA 120MM (NEEDLE) IMPLANT
NEEDLE INSUFFLATION 14GA 120MM (NEEDLE) IMPLANT
NS IRRIG 1000ML POUR BTL (IV SOLUTION) ×2 IMPLANT
PAD ARMBOARD 7.5X6 YLW CONV (MISCELLANEOUS) ×4 IMPLANT
PENCIL BUTTON HOLSTER BLD 10FT (ELECTRODE) ×2 IMPLANT
POUCH SPECIMEN RETRIEVAL 10MM (ENDOMECHANICALS) ×2 IMPLANT
RELOAD 45 VASCULAR/THIN (ENDOMECHANICALS) ×2 IMPLANT
RELOAD STAPLE 45 2.5 WHT GRN (ENDOMECHANICALS) IMPLANT
RELOAD STAPLE 45 3.5 BLU ETS (ENDOMECHANICALS) IMPLANT
RELOAD STAPLE TA45 3.5 REG BLU (ENDOMECHANICALS) ×2 IMPLANT
SCISSORS LAP 5X35 DISP (ENDOMECHANICALS) IMPLANT
SET IRRIG TUBING LAPAROSCOPIC (IRRIGATION / IRRIGATOR) IMPLANT
SET TUBE SMOKE EVAC HIGH FLOW (TUBING) ×2 IMPLANT
SHEARS HARMONIC ACE PLUS 36CM (ENDOMECHANICALS) IMPLANT
SLEEVE ENDOPATH XCEL 5M (ENDOMECHANICALS) ×2 IMPLANT
SPECIMEN JAR SMALL (MISCELLANEOUS) ×2 IMPLANT
SUT MNCRL AB 4-0 PS2 18 (SUTURE) ×2 IMPLANT
SUT VICRYL 0 UR6 27IN ABS (SUTURE) IMPLANT
TOWEL GREEN STERILE (TOWEL DISPOSABLE) ×2 IMPLANT
TOWEL GREEN STERILE FF (TOWEL DISPOSABLE) ×2 IMPLANT
TRAY LAPAROSCOPIC MC (CUSTOM PROCEDURE TRAY) ×2 IMPLANT
TROCAR XCEL BLUNT TIP 100MML (ENDOMECHANICALS) IMPLANT
TROCAR XCEL NON-BLD 5MMX100MML (ENDOMECHANICALS) ×2 IMPLANT
WATER STERILE IRR 1000ML POUR (IV SOLUTION) ×2 IMPLANT

## 2021-03-09 NOTE — Anesthesia Preprocedure Evaluation (Signed)
Anesthesia Evaluation  Patient identified by MRN, date of birth, ID band Patient awake    Reviewed: Allergy & Precautions, H&P , NPO status , Patient's Chart, lab work & pertinent test results  Airway Mallampati: II   Neck ROM: full    Dental   Pulmonary neg pulmonary ROS,    breath sounds clear to auscultation       Cardiovascular negative cardio ROS   Rhythm:regular Rate:Normal     Neuro/Psych PSYCHIATRIC DISORDERS Depression    GI/Hepatic appendicitis   Endo/Other  Hypothyroidism obese  Renal/GU      Musculoskeletal   Abdominal   Peds  Hematology   Anesthesia Other Findings   Reproductive/Obstetrics                             Anesthesia Physical Anesthesia Plan  ASA: 2 and emergent  Anesthesia Plan: General   Post-op Pain Management:    Induction: Intravenous  PONV Risk Score and Plan: 3 and Ondansetron, Dexamethasone, Midazolam and Treatment may vary due to age or medical condition  Airway Management Planned: Oral ETT  Additional Equipment:   Intra-op Plan:   Post-operative Plan: Extubation in OR  Informed Consent: I have reviewed the patients History and Physical, chart, labs and discussed the procedure including the risks, benefits and alternatives for the proposed anesthesia with the patient or authorized representative who has indicated his/her understanding and acceptance.     Dental advisory given  Plan Discussed with: CRNA, Anesthesiologist and Surgeon  Anesthesia Plan Comments:         Anesthesia Quick Evaluation

## 2021-03-09 NOTE — ED Notes (Signed)
Pt transported to short stay by this RN on stretcher. Pt changed into gown and belongings placed in bag and kept with family. Pt unable to remove her ring, OR notified. Report given to OR charge RN. No acute changes noted.

## 2021-03-09 NOTE — Transfer of Care (Signed)
Immediate Anesthesia Transfer of Care Note  Patient: Denise Mccall  Procedure(s) Performed: APPENDECTOMY LAPAROSCOPIC (Abdomen)  Patient Location: PACU  Anesthesia Type:General  Level of Consciousness: awake and drowsy  Airway & Oxygen Therapy: Patient Spontanous Breathing and Patient connected to face mask oxygen  Post-op Assessment: Report given to RN and Post -op Vital signs reviewed and stable  Post vital signs: Reviewed and stable  Last Vitals:  Vitals Value Taken Time  BP 138/75 03/09/21 0705  Temp 36.1 C 03/09/21 0705  Pulse 75 03/09/21 0706  Resp 22 03/09/21 0706  SpO2 100 % 03/09/21 0706  Vitals shown include unvalidated device data.  Last Pain:  Vitals:   03/09/21 0507  TempSrc: Oral  PainSc:          Complications: No notable events documented.

## 2021-03-09 NOTE — Anesthesia Procedure Notes (Signed)
Procedure Name: Intubation Date/Time: 03/09/2021 6:13 AM Performed by: Claudina Lick, CRNA Pre-anesthesia Checklist: Patient identified, Emergency Drugs available, Suction available and Patient being monitored Patient Re-evaluated:Patient Re-evaluated prior to induction Oxygen Delivery Method: Circle system utilized Preoxygenation: Pre-oxygenation with 100% oxygen Induction Type: IV induction Ventilation: Mask ventilation without difficulty Laryngoscope Size: Miller and 2 Grade View: Grade I Tube type: Oral Tube size: 7.0 mm Number of attempts: 1 Airway Equipment and Method: Stylet Placement Confirmation: ETT inserted through vocal cords under direct vision, positive ETCO2 and breath sounds checked- equal and bilateral Secured at: 21 cm Tube secured with: Tape Dental Injury: Teeth and Oropharynx as per pre-operative assessment

## 2021-03-09 NOTE — Discharge Instructions (Signed)
May shower beginning 03/10/2021. Do not peel off or scrub skin glue. May allow warm soapy water to run over incision, then rinse and pat dry. Do not soak in any water (tubs, hot tubs, pools, lakes, oceans) for one week.   No lifting greater than 5 pounds for six weeks.   Pain regimen: take over-the-counter tylenol (acetaminophen) 1000mg  every six hours, the prescription ibuprofen (600mg ) every six hours and the robaxin (methocarbamol) 750mg  every six hours. With all three of these, you should be taking something every two hours. Example: tylenol ( acetaminophen) at 8am, ibuprofen at 10am, robaxin (methocarbamol) at 12pm, tylenol (acetaminophen) again at 2pm, ibuprofen again at 4pm, robaxin (methocarbamol) at 6pm. You also have a prescription for oxycodone, which should be taken if the tylenol (acetaminophen), ibuprofen, and robaxin (methocarbamol) are not enough to control your pain. You may take the oxycodone as frequently as every four hours as needed, but if you are taking the other medications as above, you should not need the oxycodone this frequently. You have also been given a prescription for colace (docusate) which is a stool softener. Please take this as prescribed because the oxycodone can cause constipation and the colace (docusate) will minimize or prevent constipation.  Call the office at [phone] for temperature greater than 101.82F, worsening pain, redness or warmth at the incision site.  Please call [phone] to make an appointment for 2-3 weeks after surgery for wound check.

## 2021-03-09 NOTE — Op Note (Signed)
° °  Operative Note   Date: 03/09/2021  Procedure: laparoscopic appendectomy  Pre-op diagnosis: acute appendicitis Post-op diagnosis: Grade 1c appendicitis  Indication and clinical history: The patient is a 36 y.o. year old female with acute appendicitis     Surgeon: Diamantina Monks, MD  Anesthesiologist: Chaney Malling, MD Anesthesia: General  Findings:  Specimen: appendix EBL: <5cc Drains/Implants: none  Disposition: PACU - hemodynamically stable.  Description of procedure: The patient was positioned supine on the operating room table. Time-out was performed verifying correct patient, procedure, signature of informed consent, and administration of pre-operative antibiotics. General anesthetic induction and intubation were uneventful. Foley catheter insertion was not performed as patient voided immediately prior to the procedure . The abdomen was prepped and draped in the usual sterile fashion. An infra-umbilical incision was made using an open technique using zero vicryl stay sutures on either side of the fascia and a 2mm Hassan port inserted. After establishing pneumoperitoneum, which the patient tolerated well, the abdominal cavity was inspected and no injury of any intra-abdominal structures was identified. Two additional five millimeter ports were placed under direct visualization and using local anesthetic in the suprapubic and left lower quadrant regions. The patient was repositioned to Trendelenburg with the left side down. Further inspection of the right lower quadrant revealed no abscess or purulent fluid and grade 1c appendicitis. The appendix was dissected away from its mesoappendix and an endoscopic stapler used to divide the mesoappendix using a vascular load. A bowel load of the endoscopic stapler was used to staple across the appendix at its base. Both staple lines were inspected and found to be intact and without bleeding. The appendix was placed in an endoscopic specimen retrieval  bag, removed via the umbilical port site, and sent to pathology as a permanent specimen. The right lower quadrant was again inspected and hemostasis confirmed. Any remaining fluid identified was suctioned. The suprapubic and left lower quadrant ports were removed under direct visualization and hemostasis confirmed. The umbilical port was removed last after desufflating the abdomen and the fascia re-approximated using the stay sutures. Additional local anesthetic was administered at the umbilical incision site. The skin of all port sites was closed with 4-0 monocryl. Sterile dressings were applied. All sponge and instrument counts were correct at the conclusion of the procedure. The patient was awakened from anesthesia, extubated uneventfully, and transported to the PACU in good condition. There were no complications.   Upon entering the abdomen (organ space), I encountered infection of the appendix .  CASE DATA:  Type of patient?: DOW CASE (Surgical Hospitalist [facility])  Status of Case? URGENT Add On  Infection Present At Time Of Surgery (PATOS)?  INFECTION of the appendix   Diamantina Monks, MD General and Trauma Surgery [facility] Surgery

## 2021-03-09 NOTE — H&P (Signed)
Reason for Consult/Chief Complaint: acute appendicitis Consultant: Wilkie Aye, MD  Denise Mccall is an 36 y.o. female.   HPI: 25F with abdominal pain since 03/07/21. Initially started in the upper abdomen and then moved to the lower abdomen. Last meal 2/5 PM. Associated nausea, no vomiting. No fevers. No prior h/o colonoscopy.   Past Medical History:  Diagnosis Date   Depression    Hypothyroidism    Thyroid disease     Past Surgical History:  Procedure Laterality Date   KNEE ARTHROSCOPY W/ ACL RECONSTRUCTION  2001   ORTHOPEDIC SURGERY      Family History  Problem Relation Age of Onset   Hypothyroidism Mother    Diabetes Paternal Grandmother    Skin cancer Father    Kidney cancer Father    Heart disease Father    Crohn's disease Father     Social History:  reports that she has never smoked. She has never used smokeless tobacco. She reports current alcohol use of about 3.0 standard drinks per week. She reports that she does not use drugs.  Allergies: No Known Allergies  Medications: I have reviewed the patient's current medications.  Results for orders placed or performed during the hospital encounter of 03/08/21 (from the past 48 hour(s))  Lipase, blood     Status: None   Collection Time: 03/08/21  5:39 PM  Result Value Ref Range   Lipase 25 11 - 51 U/L    Comment: Performed at [facility], [address], [place], [place] [address]  Comprehensive metabolic panel     Status: Abnormal   Collection Time: 03/08/21  5:39 PM  Result Value Ref Range   Sodium 139 135 - 145 mmol/L   Potassium 3.5 3.5 - 5.1 mmol/L   Chloride 105 98 - 111 mmol/L   CO2 26 22 - 32 mmol/L   Glucose, Bld 123 (H) 70 - 99 mg/dL    Comment: Glucose reference range applies only to samples taken after fasting for at least 8 hours.   BUN 8 6 - 20 mg/dL   Creatinine, Ser 2.67 0.44 - 1.00 mg/dL   Calcium 9.6 8.9 - 12.4 mg/dL   Total Protein 7.2 6.5 - 8.1 g/dL   Albumin 4.0 3.5 - 5.0 g/dL    AST 19 15 - 41 U/L   ALT 22 0 - 44 U/L   Alkaline Phosphatase 81 38 - 126 U/L   Total Bilirubin 0.3 0.3 - 1.2 mg/dL   GFR, Estimated >58 >09 mL/min    Comment: (NOTE) Calculated using the CKD-EPI Creatinine Equation (2021)    Anion gap 8 5 - 15    Comment: Performed at [facility], [address], [place], [place] [address]  CBC     Status: Abnormal   Collection Time: 03/08/21  5:39 PM  Result Value Ref Range   WBC 12.0 (H) 4.0 - 10.5 K/uL   RBC 4.77 3.87 - 5.11 MIL/uL   Hemoglobin 13.1 12.0 - 15.0 g/dL   HCT 25.0 53.9 - 76.7 %   MCV 86.4 80.0 - 100.0 fL   MCH 27.5 26.0 - 34.0 pg   MCHC 31.8 30.0 - 36.0 g/dL   RDW 34.1 93.7 - 90.2 %   Platelets 298 150 - 400 K/uL   nRBC 0.0 0.0 - 0.2 %    Comment: Performed at [facility], [address], [place], [place] [address]  I-Stat beta hCG blood, ED     Status:  None   Collection Time: 03/08/21  6:12 PM  Result Value Ref Range   I-stat hCG, quantitative <5.0 <5 mIU/mL   Comment 3            Comment:   GEST. AGE      CONC.  (mIU/mL)   <=1 WEEK        5 - 50     2 WEEKS       50 - 500     3 WEEKS       100 - 10,000     4 WEEKS     1,000 - 30,000        FEMALE AND NON-PREGNANT FEMALE:     LESS THAN 5 mIU/mL   Urinalysis, Routine w reflex microscopic     Status: Abnormal   Collection Time: 03/08/21  6:30 PM  Result Value Ref Range   Color, Urine YELLOW YELLOW   APPearance HAZY (A) CLEAR   Specific Gravity, Urine 1.027 1.005 - 1.030   pH 5.0 5.0 - 8.0   Glucose, UA NEGATIVE NEGATIVE mg/dL   Hgb urine dipstick NEGATIVE NEGATIVE   Bilirubin Urine NEGATIVE NEGATIVE   Ketones, ur NEGATIVE NEGATIVE mg/dL   Protein, ur NEGATIVE NEGATIVE mg/dL   Nitrite NEGATIVE NEGATIVE   Leukocytes,Ua NEGATIVE NEGATIVE    Comment: Performed at [facility] [address], [place], [place] [address]  Resp Panel by RT-PCR (Flu A&B, Covid) Nasopharyngeal Swab     Status: None   Collection Time: 03/08/21  7:42 PM   Specimen:  Nasopharyngeal Swab; Nasopharyngeal(NP) swabs in vial transport medium  Result Value Ref Range   SARS Coronavirus 2 by RT PCR NEGATIVE NEGATIVE    Comment: (NOTE) SARS-CoV-2 target nucleic acids are NOT DETECTED.  The SARS-CoV-2 RNA is generally detectable in upper respiratory specimens during the acute phase of infection. The lowest concentration of SARS-CoV-2 viral copies this assay can detect is 138 copies/mL. A negative result does not preclude SARS-Cov-2 infection and should not be used as the sole basis for treatment or other patient management decisions. A negative result may occur with  improper specimen collection/handling, submission of specimen other than nasopharyngeal swab, presence of viral mutation(s) within the areas targeted by this assay, and inadequate number of viral copies(<138 copies/mL). A negative result must be combined with clinical observations, patient history, and epidemiological information. The expected result is Negative.  Fact Sheet for Patients:  EntrepreneurPulse.com.au  Fact Sheet for Healthcare Providers:  IncredibleEmployment.be  This test is no t yet approved or cleared by the [place] FDA and  has been authorized for detection and/or diagnosis of SARS-CoV-2 by FDA under an Emergency Use Authorization (EUA). This EUA will remain  in effect (meaning this test can be used) for the duration of the COVID-19 declaration under Section 564(b)(1) of the Act, 21 U.S.C.section 360bbb-3(b)(1), unless the authorization is terminated  or revoked sooner.       Influenza A by PCR NEGATIVE NEGATIVE   Influenza B by PCR NEGATIVE NEGATIVE    Comment: (NOTE) The Xpert Xpress SARS-CoV-2/FLU/RSV plus assay is intended as an aid in the diagnosis of influenza from Nasopharyngeal swab specimens and should not be used as a sole basis for treatment. Nasal washings and aspirates are unacceptable for Xpert Xpress  SARS-CoV-2/FLU/RSV testing.  Fact Sheet for Patients: EntrepreneurPulse.com.au  Fact Sheet for Healthcare Providers: IncredibleEmployment.be  This test is not yet approved or cleared by the [place] and has been authorized for  detection and/or diagnosis of SARS-CoV-2 by FDA under an Emergency Use Authorization (EUA). This EUA will remain in effect (meaning this test can be used) for the duration of the COVID-19 declaration under Section 564(b)(1) of the Act, 21 U.S.C. section 360bbb-3(b)(1), unless the authorization is terminated or revoked.  Performed at [facility], [place] [address], [place], [place] [address]     CT Abdomen Pelvis W Contrast  Result Date: 03/08/2021 CLINICAL DATA:  Right lower quadrant pain. EXAM: CT ABDOMEN AND PELVIS WITH CONTRAST TECHNIQUE: Multidetector CT imaging of the abdomen and pelvis was performed using the standard protocol following bolus administration of intravenous contrast. RADIATION DOSE REDUCTION: This exam was performed according to the departmental dose-optimization program which includes automated exposure control, adjustment of the mA and/or kV according to patient size and/or use of iterative reconstruction technique. CONTRAST:  183mL OMNIPAQUE IOHEXOL 300 MG/ML  SOLN COMPARISON:  None. FINDINGS: Lower chest: No acute abnormality. Hepatobiliary: No focal liver abnormality is seen. No gallstones, gallbladder wall thickening, or biliary dilatation. Pancreas: Unremarkable. No pancreatic ductal dilatation or surrounding inflammatory changes. Spleen: Normal in size without focal abnormality. Adrenals/Urinary Tract: Adrenal glands are unremarkable. Kidneys are normal, without renal calculi, focal lesion, or hydronephrosis. Bladder is unremarkable. Stomach/Bowel: Appendix is dilated and fluid-filled measuring 1 cm in diameter. There is mild surrounding inflammatory stranding. No evidence for perforation or  abscess. No evidence for bowel obstruction. There is sigmoid colon diverticulosis without evidence for diverticulitis. Stomach and small bowel are within normal limits. Vascular/Lymphatic: No significant vascular findings are present. No enlarged abdominal or pelvic lymph nodes. Reproductive: Uterus and bilateral adnexa are unremarkable. Other: There is a small fat containing paraumbilical hernia. There is no ascites or free air. Musculoskeletal: No acute or significant osseous findings. IMPRESSION: 1. Acute uncomplicated appendicitis. 2. Sigmoid colon diverticulosis. Electronically Signed   By: Ronney Asters M.D.   On: 03/08/2021 22:41    ROS 10 point review of systems is negative except as listed above in HPI.   Physical Exam Blood pressure 126/75, pulse 69, temperature 98.1 F (36.7 C), temperature source Oral, resp. rate (!) 22, height 5\' 6"  (1.676 m), weight 104.3 kg, SpO2 100 %, unknown if currently breastfeeding. Constitutional: well-developed, well-nourished HEENT: pupils equal, round, reactive to light, 5mm b/l, moist conjunctiva, external inspection of ears and nose normal, hearing intact Oropharynx: normal oropharyngeal mucosa, normal dentition Neck: no thyromegaly, trachea midline, no midline cervical tenderness to palpation Chest: breath sounds equal bilaterally, normal respiratory effort, no midline or lateral chest wall tenderness to palpation/deformity Abdomen: soft, diffuse TTP-most pronounced in the RLQ, no bruising, no hepatosplenomegaly GU: normal female genitalia  Back: no wounds, no thoracic/lumbar spine tenderness to palpation, no thoracic/lumbar spine stepoffs Rectal: deferred Extremities: 2+ radial and pedal pulses bilaterally, intact motor and sensation bilateral UE and LE, no peripheral edema MSK: unable to assess gait/station, no clubbing/cyanosis of fingers/toes, normal ROM of all four extremities Skin: warm, dry, no rashes Psych: normal memory, normal mood/affect      Assessment/Plan: 70F with acute appendicitis. Informed consent was obtained after detailed explanation of risks, including bleeding, infection, abscess, staple line leak, stump appendicitis, injury to surrounding structures, and need for conversion to open procedure. All questions answered to the patient's satisfaction. CTA/flagyl ordered.    Jesusita Oka, MD General and [facility]

## 2021-03-09 NOTE — ED Notes (Signed)
Admitting Provider at bedside. 

## 2021-03-09 NOTE — Anesthesia Postprocedure Evaluation (Signed)
Anesthesia Post Note  Patient: Michella Detjen  Procedure(s) Performed: APPENDECTOMY LAPAROSCOPIC (Abdomen)     Patient location during evaluation: PACU Anesthesia Type: General Level of consciousness: awake and alert Pain management: pain level controlled Vital Signs Assessment: post-procedure vital signs reviewed and stable Respiratory status: spontaneous breathing, nonlabored ventilation, respiratory function stable and patient connected to nasal cannula oxygen Cardiovascular status: blood pressure returned to baseline and stable Postop Assessment: no apparent nausea or vomiting Anesthetic complications: no   No notable events documented.  Last Vitals:  Vitals:   03/09/21 0805 03/09/21 0821  BP: 135/82 126/71  Pulse: 95 (!) 57  Resp: (!) 24 18  Temp:  36.9 C  SpO2: 95% 92%    Last Pain:  Vitals:   03/09/21 0735  TempSrc:   PainSc: Asleep                 Collene Schlichter

## 2021-03-10 ENCOUNTER — Encounter (HOSPITAL_COMMUNITY): Payer: Self-pay | Admitting: Surgery

## 2021-03-10 LAB — SURGICAL PATHOLOGY

## 2021-03-12 NOTE — Discharge Summary (Addendum)
° ° °  Patient ID: Denise Mccall [id number] 1985-11-07 36 y.o.  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   docusate sodium 100 MG capsule Commonly known as: Colace Take 1 capsule (100 mg total) by mouth 2 (two) times daily.   ibuprofen 600 MG tablet Commonly known as: ADVIL Take 1 tablet (600 mg total) by mouth every 6 (six) hours as needed.   methocarbamol 750 MG tablet Commonly known as: Robaxin-750 Take 1 tablet (750 mg total) by mouth 4 (four) times daily.   multivitamin tablet Take 1 tablet by mouth daily.   NP Thyroid 120 MG tablet Generic drug: thyroid Take 1 tablet (120 mg total) by mouth daily. Take with 30mg  tab   NP Thyroid 30 MG tablet Generic drug: thyroid Take 1 tablet (30 mg total) by mouth daily. Take with 120 mg tab   NP Thyroid 90 MG tablet Generic drug: thyroid Take 180 mg by mouth daily.   oxyCODONE 5 MG immediate release tablet Commonly known as: Roxicodone Take 1 tablet (5 mg total) by mouth every 4 (four) hours as needed for severe pain.   Simpesse 0.15-0.03  &0.01 MG tablet Generic drug: Levonorgestrel-Ethinyl Estradiol Take 1 tablet by mouth daily.          Follow-up Information     08-08-1996, MD Follow up in 3 week(s).   Specialty: Surgery Contact information: [address] [place] [address] [facility] [place] [phone]                  Signed: [phone], MD [facility] 03/12/2021, 7:19 AM

## 2021-04-21 ENCOUNTER — Other Ambulatory Visit: Payer: Self-pay | Admitting: Family

## 2021-04-21 DIAGNOSIS — E063 Autoimmune thyroiditis: Secondary | ICD-10-CM

## 2021-08-04 ENCOUNTER — Other Ambulatory Visit: Payer: Self-pay | Admitting: Family

## 2021-08-04 DIAGNOSIS — E063 Autoimmune thyroiditis: Secondary | ICD-10-CM

## 2021-09-22 ENCOUNTER — Encounter: Payer: Self-pay | Admitting: Family

## 2021-09-22 ENCOUNTER — Ambulatory Visit: Payer: BC Managed Care – PPO | Admitting: Family

## 2021-09-22 VITALS — BP 134/75 | HR 60 | Temp 97.7°F | Ht 66.0 in | Wt 242.8 lb

## 2021-09-22 DIAGNOSIS — E039 Hypothyroidism, unspecified: Secondary | ICD-10-CM | POA: Insufficient documentation

## 2021-09-22 DIAGNOSIS — R5383 Other fatigue: Secondary | ICD-10-CM | POA: Diagnosis not present

## 2021-09-22 DIAGNOSIS — M79621 Pain in right upper arm: Secondary | ICD-10-CM

## 2021-09-22 DIAGNOSIS — B9789 Other viral agents as the cause of diseases classified elsewhere: Secondary | ICD-10-CM

## 2021-09-22 DIAGNOSIS — E063 Autoimmune thyroiditis: Secondary | ICD-10-CM | POA: Insufficient documentation

## 2021-09-22 DIAGNOSIS — J028 Acute pharyngitis due to other specified organisms: Secondary | ICD-10-CM

## 2021-09-22 DIAGNOSIS — M79622 Pain in left upper arm: Secondary | ICD-10-CM

## 2021-09-22 DIAGNOSIS — R7301 Impaired fasting glucose: Secondary | ICD-10-CM | POA: Insufficient documentation

## 2021-09-22 DIAGNOSIS — E034 Atrophy of thyroid (acquired): Secondary | ICD-10-CM | POA: Insufficient documentation

## 2021-09-22 NOTE — Progress Notes (Signed)
Patient ID: Denise Mccall, female    DOB: November 06, 1985, 36 y.o.   MRN: [id number]  Chief Complaint  Patient presents with   Arm Pain    Pt c/o inner arm pain in both arms for about a month. Pain is achy and sore and it hurts to put her arms down.    Sore Throat    Pt states she has been having sore throats off and on for a couple of weeks and also c/o headaches. Has tried advil which does help the headaches. Pt does not want a strep test.    HPI: Sore throat/fatigue:  pt reports having pain off and on for 2 weeks, with headache and fatigue, no fever. 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 Has not had strep since childhood. States with her other sx she is concerned she has mono again.  Arm pain:  c/o aching soreness on inside of both upper arms, tender to touch, no lumps that she can palpate. Denies any erythema or bruising. Denies excessive lifting, pushing, or pulling. Denies pain in either axilla, reports long hx of breast tenderness off and on.   Assessment & Plan:  1. Pain in both upper arms advised on using heat and/or analgesic creams, can take Tylenol or Ibuprofen tid prn. Will run labs to rule out infection.  - Epstein-Barr virus VCA antibody panel; Future - CBC with Differential/Platelet; Future - Autoimmune Profile; Future  2. Sore throat (viral) off and on pain, no exudate, mild erythema, reports swollen lymph nodes off & on, none noted today, pt denies need for strep test, she is concerned about Mono again.   - Epstein-Barr virus VCA antibody panel; Future  3. Fatigue, unspecified type  - Epstein-Barr virus VCA antibody panel; Future - Autoimmune Profile; Future  Subjective:    Outpatient Medications Prior to Visit  Medication Sig Dispense Refill   ibuprofen (ADVIL) 600 MG tablet Take 1 tablet (600 mg total) by mouth every 6 (six) hours as needed. 120 tablet 1   Multiple Vitamin  (MULTIVITAMIN) tablet Take 1 tablet by mouth daily.     NP THYROID 90 MG tablet Take 180 mg by mouth daily.     oxyCODONE (ROXICODONE) 5 MG immediate release tablet Take 1 tablet (5 mg total) by mouth every 4 (four) hours as needed for severe pain. 30 tablet 0   methocarbamol (ROBAXIN-750) 750 MG tablet Take 1 tablet (750 mg total) by mouth 4 (four) times daily. 120 tablet 1   NP THYROID 120 MG tablet TAKE 1 TABLET(120 MG TOTAL) BY MOUTH EVERY DAY ALONG WITH 30 MG TABLET. 90 tablet 0   NP THYROID 30 MG tablet TAKE 1 TABLET BY MOUTH DAILY WITH 120MG  TABLET 90 tablet 0   Pseudoephedrine-APAP-DM (DAYQUIL PO) Take 2 capsules by mouth 2 (two) times daily as needed (cold symptoms).     SIMPESSE 0.15-0.03 &0.01 MG tablet Take 1 tablet by mouth daily.     docusate sodium (COLACE) 100 MG capsule Take 1 capsule (100 mg total) by mouth 2 (two) times daily. (Patient not taking: Reported on 09/22/2021) 60 capsule 2   No facility-administered medications prior to visit.   Past Medical History:  Diagnosis Date   Depression    Hypothyroidism    Thyroid disease    Past Surgical History:  Procedure Laterality Date   KNEE ARTHROSCOPY W/ ACL RECONSTRUCTION  2001   LAPAROSCOPIC APPENDECTOMY N/A 03/09/2021  Procedure: APPENDECTOMY LAPAROSCOPIC;  Surgeon: Diamantina Monks, MD;  Location: [facility] OR;  Service: General;  Laterality: N/A;   ORTHOPEDIC SURGERY     Allergies  Allergen Reactions   Levothyroxine Sodium Rash      Objective:    Physical Exam Vitals and nursing note reviewed.  Constitutional:      Appearance: Normal appearance.  HENT:     Right Ear: Tympanic membrane and ear canal normal.     Left Ear: Tympanic membrane and ear canal normal.     Mouth/Throat:     Mouth: Mucous membranes are moist.     Pharynx: Posterior oropharyngeal erythema (mild) present. No pharyngeal swelling, oropharyngeal exudate or uvula swelling.     Tonsils: No tonsillar exudate or tonsillar abscesses.  Cardiovascular:      Rate and Rhythm: Normal rate and regular rhythm.  Pulmonary:     Effort: Pulmonary effort is normal.     Breath sounds: Normal breath sounds.  Musculoskeletal:        General: Normal range of motion.  Lymphadenopathy:     Head:     Right side of head: No preauricular or posterior auricular adenopathy.     Left side of head: No preauricular or posterior auricular adenopathy.     Cervical: No cervical adenopathy.  Skin:    General: Skin is warm and dry.  Neurological:     Mental Status: She is alert.  Psychiatric:        Mood and Affect: Mood normal.        Behavior: Behavior normal.    BP 134/75 (BP Location: Left Arm, Patient Position: Sitting, Cuff Size: Large)   Pulse 60   Temp 97.7 F (36.5 C) (Temporal)   Ht 5\' 6"  (1.676 m)   Wt 242 lb 12.8 oz (110.1 kg)   LMP 09/19/2021 (Exact Date)   SpO2 96%   Breastfeeding No   BMI 39.19 kg/m  Wt Readings from Last 3 Encounters:  09/22/21 242 lb 12.8 oz (110.1 kg)  03/08/21 230 lb (104.3 kg)  01/04/21 246 lb 6.4 oz (111.8 kg)       14/05/22, NP

## 2021-09-22 NOTE — Patient Instructions (Addendum)
It was very nice to see you today!    I have sent an order for lab work to our [facility]. office.  I will review your lab results via MyChart in 1-2 days.      PLEASE NOTE:  If you had any lab tests please let us know if you have not heard back within a few days. You may see your results on MyChart before we have a chance to review them but we will give you a call once they are reviewed by [place]. If we ordered any referrals today, please let us know if you have not heard from their office within the next week.

## 2021-09-23 ENCOUNTER — Other Ambulatory Visit (INDEPENDENT_AMBULATORY_CARE_PROVIDER_SITE_OTHER): Payer: BC Managed Care – PPO

## 2021-09-23 DIAGNOSIS — M79621 Pain in right upper arm: Secondary | ICD-10-CM

## 2021-09-23 DIAGNOSIS — M79622 Pain in left upper arm: Secondary | ICD-10-CM

## 2021-09-23 DIAGNOSIS — B9789 Other viral agents as the cause of diseases classified elsewhere: Secondary | ICD-10-CM

## 2021-09-23 DIAGNOSIS — R5383 Other fatigue: Secondary | ICD-10-CM

## 2021-09-23 LAB — CBC WITH DIFFERENTIAL/PLATELET
Basophils Absolute: 0 10*3/uL (ref 0.0–0.1)
Basophils Relative: 0.5 % (ref 0.0–3.0)
Eosinophils Absolute: 0.1 10*3/uL (ref 0.0–0.7)
Eosinophils Relative: 2.1 % (ref 0.0–5.0)
HCT: 39.4 % (ref 36.0–46.0)
Hemoglobin: 13.1 g/dL (ref 12.0–15.0)
Lymphocytes Relative: 33.7 % (ref 12.0–46.0)
Lymphs Abs: 1.9 10*3/uL (ref 0.7–4.0)
MCHC: 33.2 g/dL (ref 30.0–36.0)
MCV: 86.5 fl (ref 78.0–100.0)
Monocytes Absolute: 0.5 10*3/uL (ref 0.1–1.0)
Monocytes Relative: 8.7 % (ref 3.0–12.0)
Neutro Abs: 3.1 10*3/uL (ref 1.4–7.7)
Neutrophils Relative %: 55 % (ref 43.0–77.0)
Platelets: 295 10*3/uL (ref 150.0–400.0)
RBC: 4.56 Mil/uL (ref 3.87–5.11)
RDW: 13.8 % (ref 11.5–15.5)
WBC: 5.7 10*3/uL (ref 4.0–10.5)

## 2021-09-24 LAB — EPSTEIN-BARR VIRUS VCA ANTIBODY PANEL
EBV NA IgG: >600 U/mL — ABNORMAL HIGH
EBV VCA IgG: 748 U/mL — ABNORMAL HIGH
EBV VCA IgM: <36 U/mL

## 2021-09-24 LAB — AUTOIMMUNE PROFILE
Anti Nuclear Antibody (ANA): NEGATIVE
Complement C3, Serum: 162 mg/dL (ref 82–167)
dsDNA Ab: 2 IU/mL (ref 0–9)

## 2021-09-26 NOTE — Progress Notes (Signed)
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 Your blood count is also negative for any infectious process currently. Hope you start feeling better soon, take care.

## 2021-10-25 ENCOUNTER — Encounter: Payer: Self-pay | Admitting: *Deleted

## 2021-11-19 ENCOUNTER — Other Ambulatory Visit (HOSPITAL_COMMUNITY): Payer: Self-pay

## 2021-11-19 MED ORDER — WEGOVY 0.25 MG/0.5ML ~~LOC~~ SOAJ
0.2500 mg | SUBCUTANEOUS | 1 refills | Status: AC
Start: 2021-11-19 — End: ?
  Filled 2021-11-19: qty 2, 28d supply, fill #0
  Filled 2022-02-09: qty 2, 28d supply, fill #1

## 2021-12-27 ENCOUNTER — Other Ambulatory Visit (HOSPITAL_COMMUNITY): Payer: Self-pay

## 2022-01-13 ENCOUNTER — Encounter: Payer: Self-pay | Admitting: *Deleted

## 2022-01-18 ENCOUNTER — Other Ambulatory Visit (HOSPITAL_COMMUNITY): Payer: Self-pay

## 2022-01-19 ENCOUNTER — Other Ambulatory Visit (HOSPITAL_COMMUNITY): Payer: Self-pay

## 2022-02-09 ENCOUNTER — Other Ambulatory Visit (HOSPITAL_COMMUNITY): Payer: Self-pay

## 2022-02-16 ENCOUNTER — Other Ambulatory Visit (HOSPITAL_COMMUNITY): Payer: Self-pay

## 2022-02-17 ENCOUNTER — Other Ambulatory Visit (HOSPITAL_COMMUNITY): Payer: Self-pay

## 2022-03-02 ENCOUNTER — Other Ambulatory Visit (HOSPITAL_COMMUNITY): Payer: Self-pay

## 2022-03-02 MED ORDER — WEGOVY 0.5 MG/0.5ML ~~LOC~~ SOAJ
0.5000 mg | SUBCUTANEOUS | 5 refills | Status: AC
  Filled 2022-03-02: qty 2, 28d supply, fill #0

## 2022-03-11 ENCOUNTER — Other Ambulatory Visit (HOSPITAL_COMMUNITY): Payer: Self-pay

## 2022-03-28 ENCOUNTER — Other Ambulatory Visit (HOSPITAL_COMMUNITY): Payer: Self-pay

## 2022-03-28 MED ORDER — WEGOVY 1 MG/0.5ML ~~LOC~~ SOAJ
1.0000 mg | SUBCUTANEOUS | 3 refills | Status: AC
  Filled 2022-03-28: qty 2, 28d supply, fill #0

## 2022-04-06 ENCOUNTER — Other Ambulatory Visit (HOSPITAL_COMMUNITY): Payer: Self-pay

## 2022-04-07 ENCOUNTER — Other Ambulatory Visit (HOSPITAL_COMMUNITY): Payer: Self-pay

## 2022-04-27 ENCOUNTER — Other Ambulatory Visit: Payer: Self-pay

## 2022-04-27 ENCOUNTER — Other Ambulatory Visit (HOSPITAL_COMMUNITY): Payer: Self-pay

## 2022-04-27 MED ORDER — WEGOVY 1 MG/0.5ML ~~LOC~~ SOAJ
1.0000 mg | SUBCUTANEOUS | 3 refills | Status: AC
  Filled 2022-04-27: qty 2, 28d supply, fill #0

## 2022-04-29 ENCOUNTER — Other Ambulatory Visit (HOSPITAL_COMMUNITY): Payer: Self-pay

## 2022-06-22 IMAGING — CT CT ABD-PELV W/ CM
2 of 4 series · 16 of 46 positions shown, 18 images · IV contrast (agent unspecified)
Comparison: None.

CLINICAL DATA: Right lower quadrant pain.

EXAM:
CT ABDOMEN AND PELVIS WITH CONTRAST
TECHNIQUE: Multidetector CT imaging of the abdomen and pelvis was performed
using the standard protocol following bolus administration of
intravenous contrast.

[Series 3: abdomen 5.0 · axial · 0.97mm/px · z∈[+858,+1308]mm · 13 of 103 slices shown, 15 images]
[im 7/103  soft-tissue]
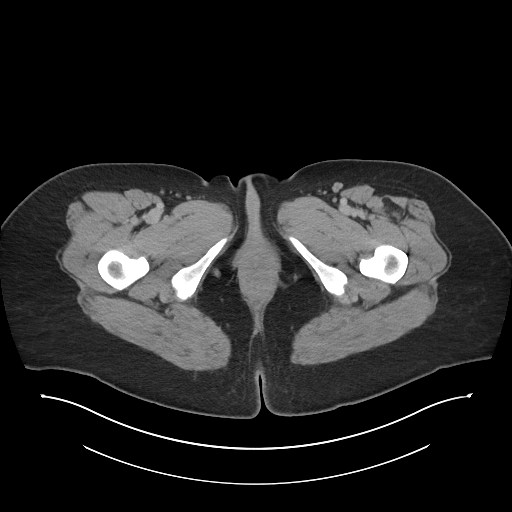
[im 7/103  bone]
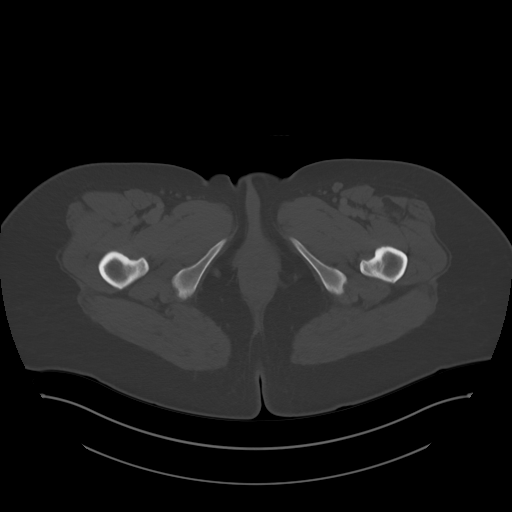
[im 13/103  soft-tissue]
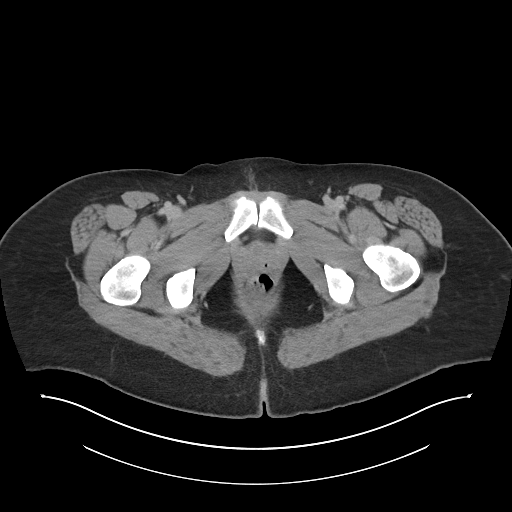
[im 25/103  soft-tissue]
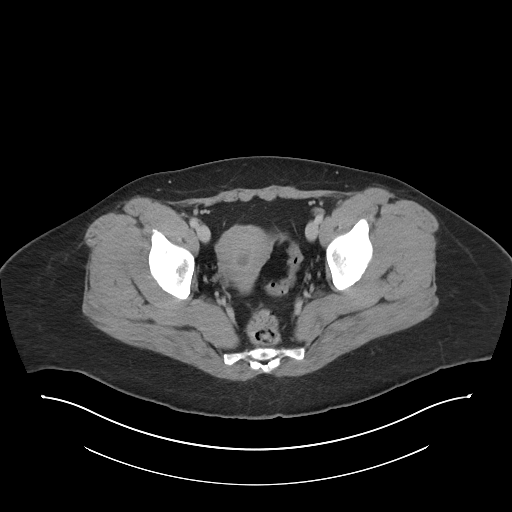
[im 31/103  soft-tissue]
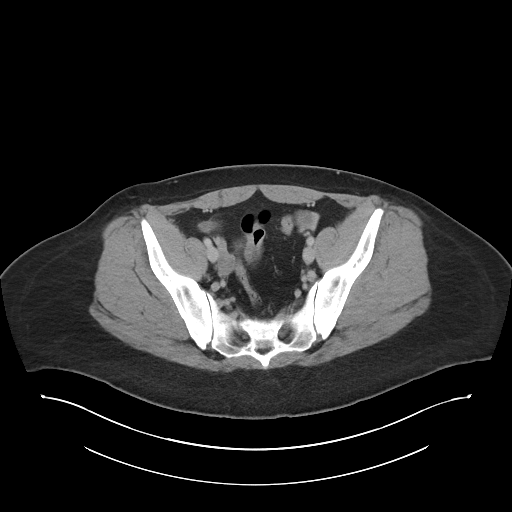
[im 37/103  soft-tissue]
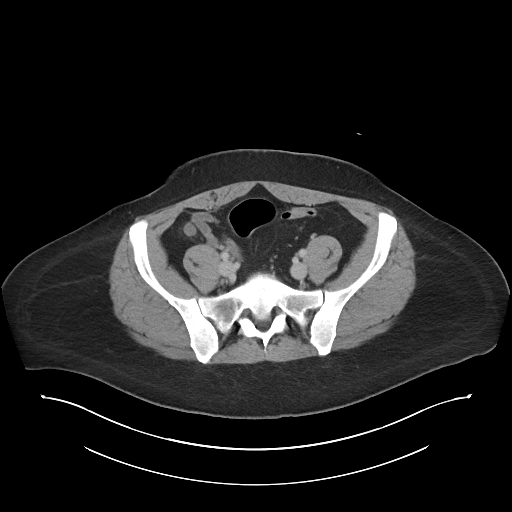
[im 43/103  soft-tissue]
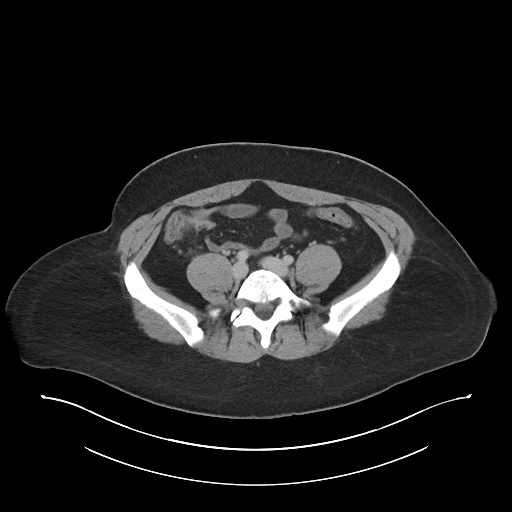
[im 55/103  soft-tissue]
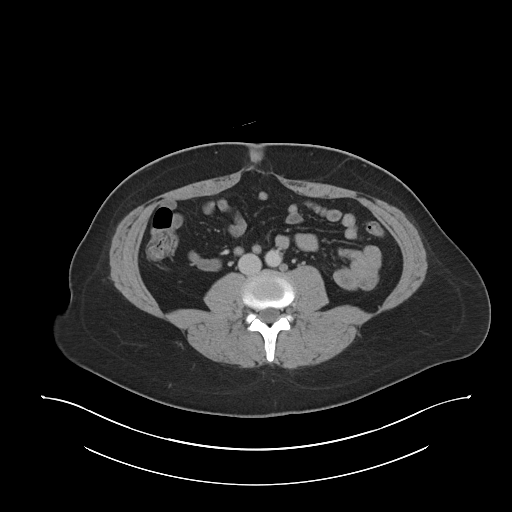
[im 61/103  soft-tissue]
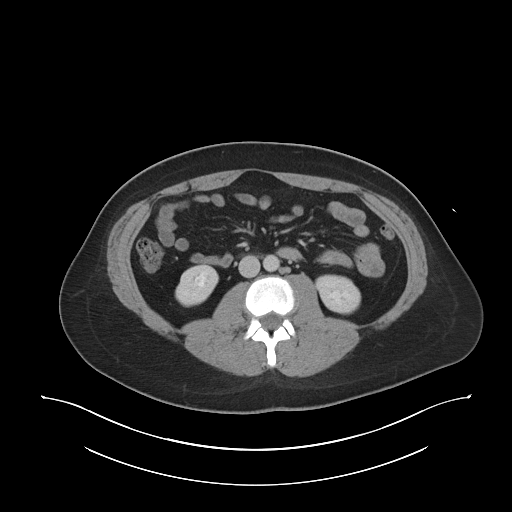
[im 67/103  soft-tissue]
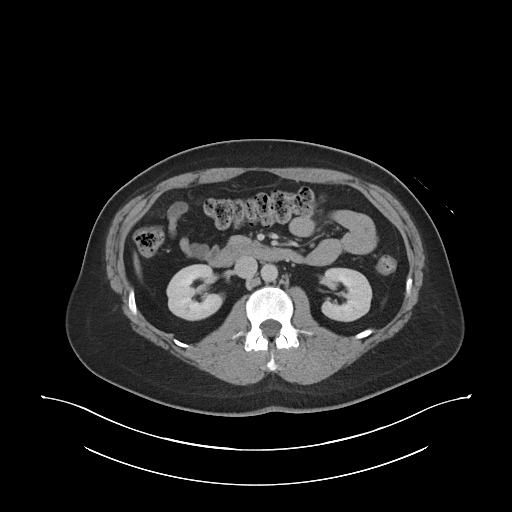
[im 67/103  bone]
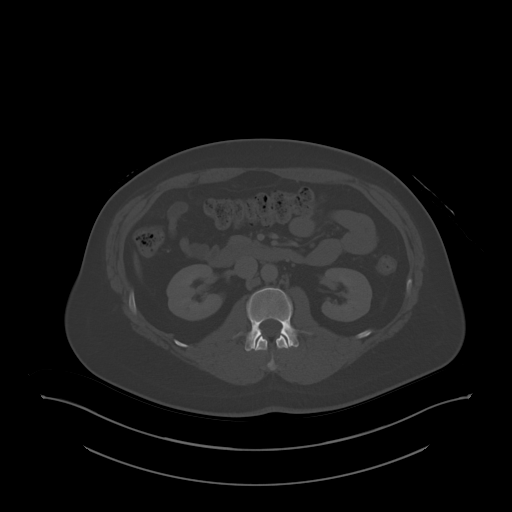
[im 73/103  soft-tissue]
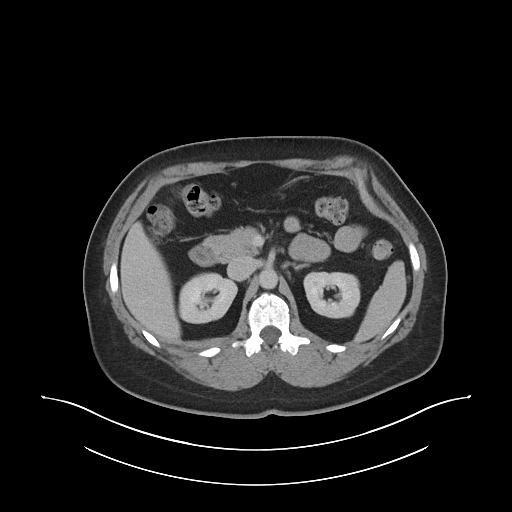
[im 79/103  soft-tissue]
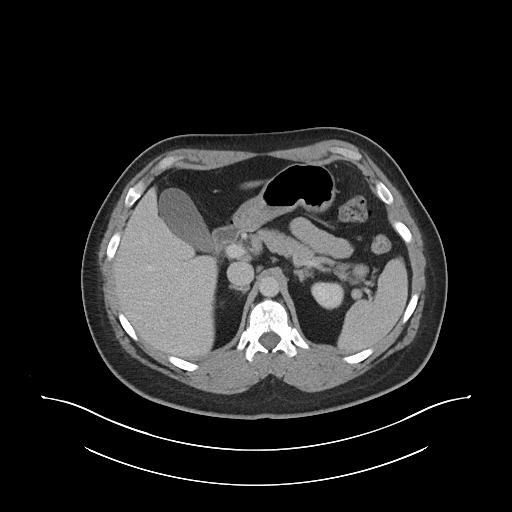
[im 91/103  soft-tissue]
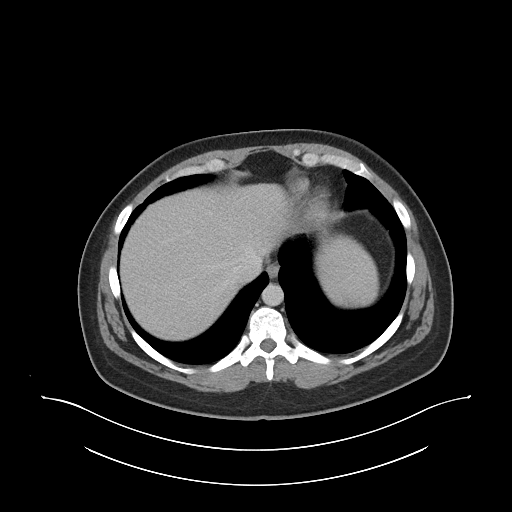
[im 97/103  soft-tissue]
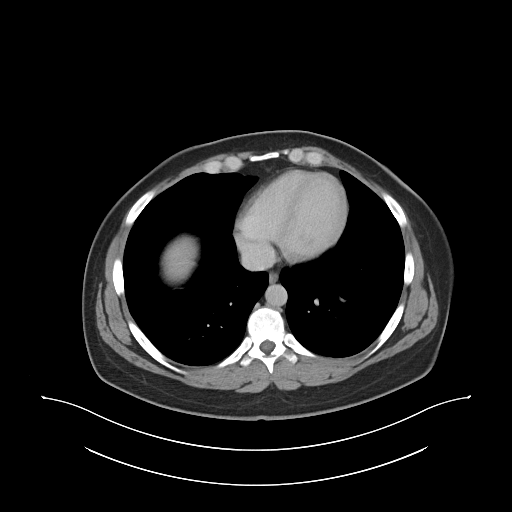

[Series 6: abdomen 3.0 mpr cor · coronal · 0.98mm/px · 3 of 97 slices shown]
[im 33/97  soft-tissue]
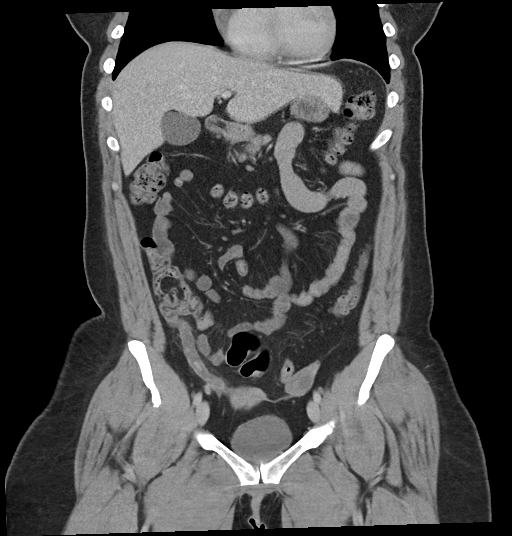
[im 43/97  soft-tissue]
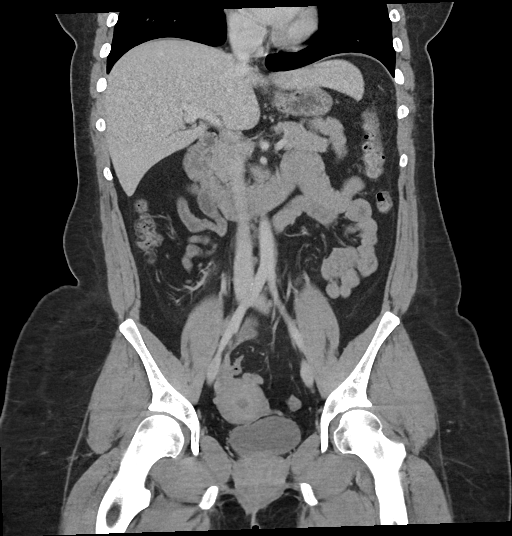
[im 54/97  soft-tissue]
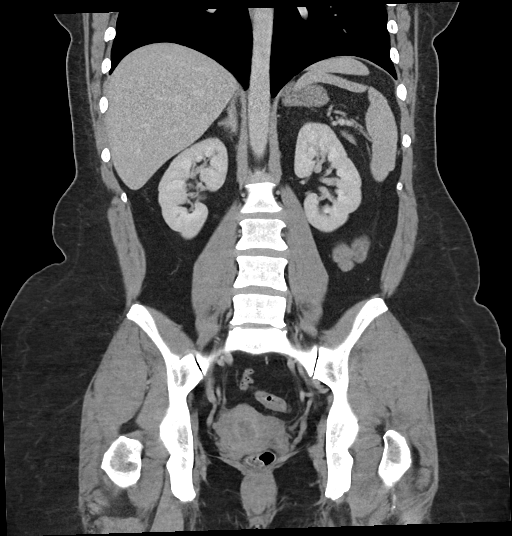

[16 of 46 positions shown; findings below may reference images not displayed]

RADIATION DOSE REDUCTION: This exam was performed according to the
departmental dose-optimization program which includes automated
exposure control, adjustment of the mA and/or kV according to
patient size and/or use of iterative reconstruction technique.

CONTRAST:  100mL OMNIPAQUE IOHEXOL 300 MG/ML  SOLN
FINDINGS: Lower chest: No acute abnormality.

Hepatobiliary: No focal liver abnormality is seen. No gallstones,
gallbladder wall thickening, or biliary dilatation.

Pancreas: Unremarkable. No pancreatic ductal dilatation or
surrounding inflammatory changes.

Spleen: Normal in size without focal abnormality.

Adrenals/Urinary Tract: Adrenal glands are unremarkable. Kidneys are
normal, without renal calculi, focal lesion, or hydronephrosis.
Bladder is unremarkable.

Stomach/Bowel: Appendix is dilated and fluid-filled measuring 1 cm
in diameter. There is mild surrounding inflammatory stranding. No
evidence for perforation or abscess. No evidence for bowel
obstruction. There is sigmoid colon diverticulosis without evidence
for diverticulitis. Stomach and small bowel are within normal
limits.

Vascular/Lymphatic: No significant vascular findings are present. No
enlarged abdominal or pelvic lymph nodes.

Reproductive: Uterus and bilateral adnexa are unremarkable.

Other: There is a small fat containing paraumbilical hernia. There
is no ascites or free air.

Musculoskeletal: No acute or significant osseous findings.
IMPRESSION: 1. Acute uncomplicated appendicitis.
2. Sigmoid colon diverticulosis.
